# Patient Record
Sex: Male | Born: 1970 | Hispanic: Yes | Marital: Single | Smoking: Current every day smoker
Health system: Southern US, Community
[De-identification: ages and names within clinical notes are randomized; demographics above are authoritative.]

## PROBLEM LIST (undated history)

## (undated) ENCOUNTER — Emergency Department (HOSPITAL_COMMUNITY): Payer: Self-pay

## (undated) DIAGNOSIS — I10 Essential (primary) hypertension: Secondary | ICD-10-CM

## (undated) DIAGNOSIS — E1140 Type 2 diabetes mellitus with diabetic neuropathy, unspecified: Secondary | ICD-10-CM

## (undated) HISTORY — PX: TOE AMPUTATION: SHX809

---

## 2014-12-01 ENCOUNTER — Emergency Department (HOSPITAL_COMMUNITY): Payer: Self-pay

## 2014-12-01 ENCOUNTER — Inpatient Hospital Stay (HOSPITAL_COMMUNITY)
Admission: EM | Admit: 2014-12-01 | Discharge: 2014-12-10 | DRG: 580 | Disposition: A | Discharge status: Home / Self Care | Payer: Self-pay | Attending: Internal Medicine | Admitting: Internal Medicine

## 2014-12-01 ENCOUNTER — Encounter (HOSPITAL_COMMUNITY): Payer: Self-pay

## 2014-12-01 DIAGNOSIS — K5900 Constipation, unspecified: Secondary | ICD-10-CM | POA: Diagnosis present

## 2014-12-01 DIAGNOSIS — S91331A Puncture wound without foreign body, right foot, initial encounter: Secondary | ICD-10-CM

## 2014-12-01 DIAGNOSIS — N179 Acute kidney failure, unspecified: Secondary | ICD-10-CM | POA: Diagnosis present

## 2014-12-01 DIAGNOSIS — S91339A Puncture wound without foreign body, unspecified foot, initial encounter: Secondary | ICD-10-CM | POA: Diagnosis present

## 2014-12-01 DIAGNOSIS — Z794 Long term (current) use of insulin: Secondary | ICD-10-CM

## 2014-12-01 DIAGNOSIS — T368X5A Adverse effect of other systemic antibiotics, initial encounter: Secondary | ICD-10-CM | POA: Diagnosis not present

## 2014-12-01 DIAGNOSIS — I10 Essential (primary) hypertension: Secondary | ICD-10-CM | POA: Diagnosis present

## 2014-12-01 DIAGNOSIS — R112 Nausea with vomiting, unspecified: Secondary | ICD-10-CM

## 2014-12-01 DIAGNOSIS — L97509 Non-pressure chronic ulcer of other part of unspecified foot with unspecified severity: Secondary | ICD-10-CM | POA: Diagnosis present

## 2014-12-01 DIAGNOSIS — E1121 Type 2 diabetes mellitus with diabetic nephropathy: Secondary | ICD-10-CM | POA: Diagnosis present

## 2014-12-01 DIAGNOSIS — Z79899 Other long term (current) drug therapy: Secondary | ICD-10-CM

## 2014-12-01 DIAGNOSIS — L02611 Cutaneous abscess of right foot: Secondary | ICD-10-CM | POA: Diagnosis present

## 2014-12-01 DIAGNOSIS — L02619 Cutaneous abscess of unspecified foot: Secondary | ICD-10-CM | POA: Diagnosis present

## 2014-12-01 DIAGNOSIS — I129 Hypertensive chronic kidney disease with stage 1 through stage 4 chronic kidney disease, or unspecified chronic kidney disease: Secondary | ICD-10-CM | POA: Diagnosis present

## 2014-12-01 DIAGNOSIS — R700 Elevated erythrocyte sedimentation rate: Secondary | ICD-10-CM | POA: Diagnosis present

## 2014-12-01 DIAGNOSIS — F17210 Nicotine dependence, cigarettes, uncomplicated: Secondary | ICD-10-CM | POA: Diagnosis present

## 2014-12-01 DIAGNOSIS — L97519 Non-pressure chronic ulcer of other part of right foot with unspecified severity: Secondary | ICD-10-CM

## 2014-12-01 DIAGNOSIS — E118 Type 2 diabetes mellitus with unspecified complications: Secondary | ICD-10-CM

## 2014-12-01 DIAGNOSIS — N189 Chronic kidney disease, unspecified: Secondary | ICD-10-CM | POA: Diagnosis present

## 2014-12-01 DIAGNOSIS — E1140 Type 2 diabetes mellitus with diabetic neuropathy, unspecified: Secondary | ICD-10-CM | POA: Diagnosis present

## 2014-12-01 DIAGNOSIS — Z23 Encounter for immunization: Secondary | ICD-10-CM

## 2014-12-01 DIAGNOSIS — L03115 Cellulitis of right lower limb: Principal | ICD-10-CM | POA: Diagnosis present

## 2014-12-01 DIAGNOSIS — E1142 Type 2 diabetes mellitus with diabetic polyneuropathy: Secondary | ICD-10-CM

## 2014-12-01 DIAGNOSIS — W450XXA Nail entering through skin, initial encounter: Secondary | ICD-10-CM | POA: Diagnosis present

## 2014-12-01 DIAGNOSIS — E11649 Type 2 diabetes mellitus with hypoglycemia without coma: Secondary | ICD-10-CM | POA: Diagnosis present

## 2014-12-01 DIAGNOSIS — N183 Chronic kidney disease, stage 3 (moderate): Secondary | ICD-10-CM | POA: Diagnosis present

## 2014-12-01 HISTORY — DX: Type 2 diabetes mellitus with diabetic neuropathy, unspecified: E11.40

## 2014-12-01 HISTORY — DX: Essential (primary) hypertension: I10

## 2014-12-01 LAB — CBC WITH DIFFERENTIAL/PLATELET
Basophils Absolute: 0 10*3/uL (ref 0.0–0.1)
Basophils Relative: 0 % (ref 0–1)
EOS ABS: 0.4 10*3/uL (ref 0.0–0.7)
Eosinophils Relative: 3 % (ref 0–5)
HCT: 36.4 % — ABNORMAL LOW (ref 39.0–52.0)
Hemoglobin: 12.3 g/dL — ABNORMAL LOW (ref 13.0–17.0)
LYMPHS ABS: 3.7 10*3/uL (ref 0.7–4.0)
Lymphocytes Relative: 22 % (ref 12–46)
MCH: 29 pg (ref 26.0–34.0)
MCHC: 33.8 g/dL (ref 30.0–36.0)
MCV: 85.8 fL (ref 78.0–100.0)
MONOS PCT: 6 % (ref 3–12)
Monocytes Absolute: 1 10*3/uL (ref 0.1–1.0)
NEUTROS PCT: 69 % (ref 43–77)
Neutro Abs: 11.9 10*3/uL — ABNORMAL HIGH (ref 1.7–7.7)
Platelets: 229 10*3/uL (ref 150–400)
RBC: 4.24 MIL/uL (ref 4.22–5.81)
RDW: 12.5 % (ref 11.5–15.5)
WBC: 17.1 10*3/uL — AB (ref 4.0–10.5)

## 2014-12-01 LAB — BASIC METABOLIC PANEL
Anion gap: 8 (ref 5–15)
BUN: 17 mg/dL (ref 6–20)
CALCIUM: 8.8 mg/dL — AB (ref 8.9–10.3)
CO2: 24 mmol/L (ref 22–32)
Chloride: 104 mmol/L (ref 101–111)
Creatinine, Ser: 1.57 mg/dL — ABNORMAL HIGH (ref 0.61–1.24)
GFR, EST NON AFRICAN AMERICAN: 52 mL/min — AB (ref >60)
Glucose, Bld: 208 mg/dL — ABNORMAL HIGH (ref 70–99)
POTASSIUM: 4.3 mmol/L (ref 3.5–5.1)
Sodium: 136 mmol/L (ref 135–145)

## 2014-12-01 LAB — LACTIC ACID, PLASMA: LACTIC ACID, VENOUS: 0.9 mmol/L (ref 0.5–2.0)

## 2014-12-01 LAB — CK: Total CK: 105 U/L (ref 49–397)

## 2014-12-01 LAB — SEDIMENTATION RATE: Sed Rate: 121 mm/hr — ABNORMAL HIGH (ref 0–16)

## 2014-12-01 LAB — C-REACTIVE PROTEIN: CRP: 15.5 mg/dL — ABNORMAL HIGH (ref <1.0)

## 2014-12-01 MED ORDER — ONDANSETRON HCL 4 MG/2ML IJ SOLN
4.0000 mg | Freq: Four times a day (QID) | INTRAMUSCULAR | Status: DC | PRN
  Administered 2014-12-03 – 2014-12-07 (×2): 4 mg via INTRAVENOUS
  Filled 2014-12-01 (×2): qty 2

## 2014-12-01 MED ORDER — HYDROCODONE-ACETAMINOPHEN 5-325 MG PO TABS
1.0000 | ORAL_TABLET | ORAL | Status: DC | PRN
  Administered 2014-12-02 – 2014-12-05 (×13): 2 via ORAL
  Filled 2014-12-01 (×13): qty 2

## 2014-12-01 MED ORDER — TETANUS-DIPHTH-ACELL PERTUSSIS 5-2.5-18.5 LF-MCG/0.5 IM SUSP
0.5000 mL | Freq: Once | INTRAMUSCULAR | Status: AC
  Administered 2014-12-01: 0.5 mL via INTRAMUSCULAR
  Filled 2014-12-01: qty 0.5

## 2014-12-01 MED ORDER — HYDROMORPHONE HCL 1 MG/ML IJ SOLN
1.0000 mg | Freq: Once | INTRAMUSCULAR | Status: AC
  Administered 2014-12-01: 1 mg via INTRAVENOUS
  Filled 2014-12-01: qty 1

## 2014-12-01 MED ORDER — ACETAMINOPHEN 325 MG PO TABS
650.0000 mg | ORAL_TABLET | Freq: Four times a day (QID) | ORAL | Status: DC | PRN
  Administered 2014-12-06: 650 mg via ORAL
  Filled 2014-12-01: qty 2

## 2014-12-01 MED ORDER — INSULIN ASPART 100 UNIT/ML ~~LOC~~ SOLN: 0.0000 [IU] | Freq: Every day | SUBCUTANEOUS | Status: DC

## 2014-12-01 MED ORDER — GABAPENTIN 100 MG PO CAPS
100.0000 mg | ORAL_CAPSULE | Freq: Two times a day (BID) | ORAL | Status: DC
  Administered 2014-12-02 – 2014-12-10 (×18): 100 mg via ORAL
  Filled 2014-12-01 (×18): qty 1

## 2014-12-01 MED ORDER — VANCOMYCIN HCL IN DEXTROSE 1-5 GM/200ML-% IV SOLN
1000.0000 mg | Freq: Once | INTRAVENOUS | Status: AC
  Administered 2014-12-01: 1000 mg via INTRAVENOUS
  Filled 2014-12-01: qty 200

## 2014-12-01 MED ORDER — ONDANSETRON HCL 4 MG PO TABS
4.0000 mg | ORAL_TABLET | Freq: Four times a day (QID) | ORAL | Status: DC | PRN
  Administered 2014-12-06: 4 mg via ORAL
  Filled 2014-12-01: qty 1

## 2014-12-01 MED ORDER — ACETAMINOPHEN 650 MG RE SUPP
650.0000 mg | Freq: Four times a day (QID) | RECTAL | Status: DC | PRN
Start: 2014-12-01 — End: 2014-12-10

## 2014-12-01 MED ORDER — PIPERACILLIN-TAZOBACTAM 3.375 G IVPB 30 MIN
3.3750 g | Freq: Once | INTRAVENOUS | Status: AC
  Administered 2014-12-01: 3.375 g via INTRAVENOUS
  Filled 2014-12-01: qty 50

## 2014-12-01 MED ORDER — INSULIN ASPART 100 UNIT/ML ~~LOC~~ SOLN
0.0000 [IU] | Freq: Three times a day (TID) | SUBCUTANEOUS | Status: DC
  Administered 2014-12-02: 2 [IU] via SUBCUTANEOUS
  Administered 2014-12-02: 3 [IU] via SUBCUTANEOUS
  Administered 2014-12-02: 5 [IU] via SUBCUTANEOUS
  Administered 2014-12-03: 3 [IU] via SUBCUTANEOUS
  Administered 2014-12-04: 2 [IU] via SUBCUTANEOUS
  Administered 2014-12-05 – 2014-12-06 (×2): 3 [IU] via SUBCUTANEOUS
  Administered 2014-12-06: 2 [IU] via SUBCUTANEOUS
  Administered 2014-12-06: 3 [IU] via SUBCUTANEOUS
  Administered 2014-12-07: 5 [IU] via SUBCUTANEOUS
  Administered 2014-12-08: 3 [IU] via SUBCUTANEOUS
  Administered 2014-12-09: 2 [IU] via SUBCUTANEOUS
  Administered 2014-12-09: 3 [IU] via SUBCUTANEOUS
  Administered 2014-12-10 (×2): 2 [IU] via SUBCUTANEOUS

## 2014-12-01 MED ORDER — HEPARIN SODIUM (PORCINE) 5000 UNIT/ML IJ SOLN
5000.0000 [IU] | Freq: Three times a day (TID) | INTRAMUSCULAR | Status: DC
  Administered 2014-12-02 – 2014-12-10 (×22): 5000 [IU] via SUBCUTANEOUS
  Filled 2014-12-01 (×21): qty 1

## 2014-12-01 NOTE — ED Notes (Signed)
Pt stepped on a nail 3 days ago and now presents with redness and swelling and pain to right foot.

## 2014-12-01 NOTE — ED Provider Notes (Signed)
CSN: [id number]     Arrival date & time 12/01/14  1653 History   First MD Initiated Contact with Patient 12/01/14 1908     Chief Complaint  Patient presents with  . Foot Pain  . Cellulitis     HPI Patient reports he stepped on a nail 4 days ago with his right foot.  This nail came through his shoe.  He now presents emergency department because of increasing pain, redness, swelling of his right foot with the erythema streaking up to his right medial malleolus.  He is a diabetic.  He takes insulin.  [place].  He's had prior infection in his left foot before which resulted in imitation of toe.  He is concerned about the possibility of worsening infection.  Chills at home without documented fever.   Past Medical History  Diagnosis Date  . Diabetes mellitus without complication   . Hypertension    Past Surgical History  Procedure Laterality Date  . Toe amputation Left     great toe   History reviewed. No pertinent family history. History  Substance Use Topics  . Smoking status: Current Every Day Smoker  . Smokeless tobacco: Not on file  . Alcohol Use: No    Review of Systems  All other systems reviewed and are negative.     Allergies  Review of patient's allergies indicates no known allergies.  Home Medications   Prior to Admission medications   Not on File   BP 196/80 mmHg  Pulse 99  Temp(Src) 99 F (37.2 C) (Oral)  Resp 18  Ht 5\' 6"  (1.676 m)  Wt 153 lb 11.2 oz (69.718 kg)  BMI 24.82 kg/m2  SpO2 100% Physical Exam  Constitutional: He is oriented to person, place, and time. He appears well-developed and well-nourished.  HENT:  Head: Normocephalic.  Eyes: EOM are normal.  Neck: Normal range of motion.  Cardiovascular: Regular rhythm.   Tachycardia  Pulmonary/Chest: Effort normal.  Abdominal: He exhibits no distension.  Musculoskeletal:  Obvious puncture wound of the volar surface of the right foot around the midfoot.   Streaking erythema and swelling with associated tenderness of the right foot along the medial malleolus.  No fluctuance.  No drainage.  Normal pulses in right foot.  Neurological: He is alert and oriented to person, place, and time.  Psychiatric: He has a normal mood and affect.  Nursing note and vitals reviewed.   ED Course  Procedures (including critical care time) Labs Review Labs Reviewed  CBC WITH DIFFERENTIAL/PLATELET - Abnormal; Notable for the following:    WBC 17.1 (*)    Hemoglobin 12.3 (*)    HCT 36.4 (*)    Neutro Abs 11.9 (*)    All other components within normal limits  BASIC METABOLIC PANEL - Abnormal; Notable for the following:    Glucose, Bld 208 (*)    Creatinine, Ser 1.57 (*)    Calcium 8.8 (*)    GFR calc non Af Amer 52 (*)    All other components within normal limits  SEDIMENTATION RATE  C-REACTIVE PROTEIN    Imaging Review Dg Foot Complete Right  12/01/2014   CLINICAL DATA:  Right foot pain stepped on nail 3 days ago redness swelling around foot cellulitis puncture wound lateral aspect first toe  EXAM: RIGHT FOOT COMPLETE - 3+ VIEW  COMPARISON:  None.  FINDINGS: Soft tissue swelling first toe. No fracture or dislocation. No radiodense foreign body. No evidence of  periosteal reaction or osteomyelitis.  IMPRESSION: Soft tissue swelling suggesting cellulitis.   Electronically Signed   By: Esperanza Heir M.D.   On: 12/01/2014 20:18  I personally reviewed the imaging tests through PACS system I reviewed available ER/hospitalization records through the EMR    EKG Interpretation None      MDM   Final diagnoses:  None    Given partial wound and diabetes patient is at high risk for worsening infection.  Will omit to the hospital for observation.  IV antibiotics now.  If his symptoms do not improve he'll likely need MRI of his right foot to evaluate for deep space infection plus minus operative washout.    Azalia Bilis, MD 12/01/14 2035

## 2014-12-01 NOTE — ED Notes (Signed)
Patient transported to X-ray 

## 2014-12-01 NOTE — ED Notes (Signed)
Cellulitis Borders outlined

## 2014-12-01 NOTE — H&P (Signed)
[facility] History and Physical  Patient: Juan Werner  MRN: [id number]  DOB: Feb 09, 1971  DOS: the patient was seen and examined on 12/01/2014 PCP: No primary care provider on file.  Referring physician: Dr. Venora Maples Chief Complaint: Leg pain  HPI: Kean Gautreau is a 44 y.o. male with Past medical history of diabetes mellitus hypertension and neuropathy. The patient is presenting with complaints of foot pain on the right. He mentions that 2 days ago while at work he placed his leg on a nail and had injury. After that he had progressively worsening foot pain. He has swelling of his foot with redness and worsening swelling. Therefore he came to the hospital. He has bilateral burning pain of his foot which is chronic. He denies any fever but 5 chills. Does not have any shortness of breath or cough does not have any nausea or vomiting no complaints of abdominal pain. No abdominal pain diarrhea nausea vomiting.  The patient is coming from home And at his baseline independent for most of his ADL.  Review of Systems: as mentioned in the history of present illness.  A comprehensive review of the other systems is negative.  Past Medical History  Diagnosis Date  . Diabetes mellitus without complication   . Hypertension    Past Surgical History  Procedure Laterality Date  . Toe amputation Left     great toe   Social History:  reports that he has been smoking.  He does not have any smokeless tobacco history on file. He reports that he does not drink alcohol or use illicit drugs.  No Known Allergies  History reviewed. No pertinent family history.  Prior to Admission medications   Medication Sig Start Date End Date Taking? Authorizing Provider  acetaminophen (TYLENOL) 325 MG tablet Take 325 mg by mouth every 6 (six) hours as needed for mild pain or moderate pain.   Yes Historical Provider, MD  amLODipine (NORVASC) 10 MG tablet Take 10 mg by mouth daily.   Yes Historical  Provider, MD  gabapentin (NEURONTIN) 100 MG capsule Take 100 mg by mouth 2 (two) times daily.   Yes Historical Provider, MD  insulin NPH-regular Human (NOVOLIN 70/30) (70-30) 100 UNIT/ML injection Inject 25 Units into the skin 2 (two) times daily with a meal.   Yes Historical Provider, MD  lisinopril (PRINIVIL,ZESTRIL) 40 MG tablet Take 40 mg by mouth daily.   Yes Historical Provider, MD    Physical Exam: Filed Vitals:   12/01/14 2130 12/01/14 2145 12/01/14 2200 12/01/14 2215  BP: 146/76 179/79 170/80 151/74  Pulse: 85 95 91 85  Temp:      TempSrc:      Resp:      Height:      Weight:      SpO2: 99% 100% 100% 99%    General: Alert, Awake and Oriented to Time, Place and Person. Appear in mild distress Eyes: PERRL ENT: Oral Mucosa clear moist. Neck: no JVD Cardiovascular: S1 and S2 Present, no Murmur, Peripheral Pulses Present Respiratory: Bilateral Air entry equal and Decreased,  Clear to Auscultation, no Crackles, no wheezes Abdomen: Bowel Sound present, Soft and non tender Skin: puncture wound on the right foot with erythema and swelling and induration of the surrounding area without any discharge  Extremities: Right  Pedal edema,  bilateral leg tenderness Neurologic: lower extremity paresthesia  Labs on Admission:  CBC:  Recent Labs Lab 12/01/14 1930  WBC 17.1*  NEUTROABS 11.9*  HGB 12.3*  HCT 36.4*  MCV  85.8  PLT 229    CMP     Component Value Date/Time   NA 136 12/01/2014 1930   K 4.3 12/01/2014 1930   CL 104 12/01/2014 1930   CO2 24 12/01/2014 1930   GLUCOSE 208* 12/01/2014 1930   BUN 17 12/01/2014 1930   CREATININE 1.57* 12/01/2014 1930   CALCIUM 8.8* 12/01/2014 1930   GFRNONAA 52* 12/01/2014 1930   GFRAA >60 12/01/2014 1930    No results for input(s): LIPASE, AMYLASE in the last 168 hours.   Recent Labs Lab 12/01/14 2210  CKTOTAL 105   BNP (last 3 results) No results for input(s): BNP in the last 8760 hours.  ProBNP (last 3 results) No  results for input(s): PROBNP in the last 8760 hours.   Radiological Exams on Admission: Dg Foot Complete Right  12/01/2014   CLINICAL DATA:  Right foot pain stepped on nail 3 days ago redness swelling around foot cellulitis puncture wound lateral aspect first toe  EXAM: RIGHT FOOT COMPLETE - 3+ VIEW  COMPARISON:  None.  FINDINGS: Soft tissue swelling first toe. No fracture or dislocation. No radiodense foreign body. No evidence of periosteal reaction or osteomyelitis.  IMPRESSION: Soft tissue swelling suggesting cellulitis.   Electronically Signed   By: Skipper Cliche M.D.   On: 12/01/2014 20:18   Assessment/Plan Principal Problem:   Cellulitis of right foot Active Problems:   Neuropathic foot ulcer   Essential hypertension   Diabetic neuropathy   CKD (chronic kidney disease)   1. Cellulitis of right foot The patient is presenting with complaints of right foot infection. He has diabetes and hypertension with chronic peripheral neuropathy. The injuries from a nail wound. The patient is significantly elevated ESR as well as CRP. Concern is for this infection has been progressing further. At present I will continue treating him with broad-spectrum antibiotics. Follow cultures.  2. Excision hypertension. The patient complains that he has history of blood pressure but does not remember his medications. at present his blood pressure is stable we'll continue to closely monitor.  3. Diabetes mellitus with nephropathy. We will place him on sliding scale. Patient mentions he takes insulin twice a day but does not be the dose. check hemoglobin A1c prior control unknown.  4. Nail injury Patient has received tetanus vaccine.  DVT Prophylaxis: subcutaneous Heparin Nutrition:  Diabetic diet  Disposition: Admitted as inpatient, med-surge unit.  Author: Berle Mull, MD [facility] Pager: [phone] 12/01/2014  If 7PM-7AM, please contact night-coverage [email or web address] Password  [email or web address]

## 2014-12-01 NOTE — ED Notes (Signed)
Admitting md at bedsside

## 2014-12-01 NOTE — ED Notes (Signed)
Report called to Tess Admitting at bedside.

## 2014-12-02 ENCOUNTER — Encounter (HOSPITAL_COMMUNITY): Payer: Self-pay | Admitting: Internal Medicine

## 2014-12-02 LAB — COMPREHENSIVE METABOLIC PANEL
ALT: 12 U/L — ABNORMAL LOW (ref 17–63)
ANION GAP: 8 (ref 5–15)
AST: 13 U/L — ABNORMAL LOW (ref 15–41)
Albumin: 2.2 g/dL — ABNORMAL LOW (ref 3.5–5.0)
Alkaline Phosphatase: 58 U/L (ref 38–126)
BUN: 16 mg/dL (ref 6–20)
CO2: 23 mmol/L (ref 22–32)
Calcium: 8.3 mg/dL — ABNORMAL LOW (ref 8.9–10.3)
Chloride: 104 mmol/L (ref 101–111)
Creatinine, Ser: 1.52 mg/dL — ABNORMAL HIGH (ref 0.61–1.24)
GFR calc Af Amer: >60 mL/min (ref >60)
GFR, EST NON AFRICAN AMERICAN: 55 mL/min — AB (ref >60)
Glucose, Bld: 231 mg/dL — ABNORMAL HIGH (ref 70–99)
POTASSIUM: 4.5 mmol/L (ref 3.5–5.1)
Sodium: 135 mmol/L (ref 135–145)
TOTAL PROTEIN: 5.9 g/dL — AB (ref 6.5–8.1)
Total Bilirubin: 0.5 mg/dL (ref 0.3–1.2)

## 2014-12-02 LAB — CBC
HCT: 35.2 % — ABNORMAL LOW (ref 39.0–52.0)
HEMOGLOBIN: 11.7 g/dL — AB (ref 13.0–17.0)
MCH: 28.5 pg (ref 26.0–34.0)
MCHC: 33.2 g/dL (ref 30.0–36.0)
MCV: 85.9 fL (ref 78.0–100.0)
PLATELETS: 220 10*3/uL (ref 150–400)
RBC: 4.1 MIL/uL — AB (ref 4.22–5.81)
RDW: 12.4 % (ref 11.5–15.5)
WBC: 16 10*3/uL — AB (ref 4.0–10.5)

## 2014-12-02 LAB — GLUCOSE, CAPILLARY
Glucose-Capillary: 122 mg/dL — ABNORMAL HIGH (ref 70–99)
Glucose-Capillary: 170 mg/dL — ABNORMAL HIGH (ref 70–99)
Glucose-Capillary: 186 mg/dL — ABNORMAL HIGH (ref 70–99)
Glucose-Capillary: 210 mg/dL — ABNORMAL HIGH (ref 70–99)
Glucose-Capillary: 64 mg/dL — ABNORMAL LOW (ref 70–99)
Glucose-Capillary: 67 mg/dL — ABNORMAL LOW (ref 70–99)
Glucose-Capillary: 92 mg/dL (ref 70–99)

## 2014-12-02 MED ORDER — LISINOPRIL 40 MG PO TABS
40.0000 mg | ORAL_TABLET | Freq: Every day | ORAL | Status: DC
  Administered 2014-12-02 – 2014-12-06 (×5): 40 mg via ORAL
  Filled 2014-12-02: qty 1
  Filled 2014-12-02: qty 2
  Filled 2014-12-02 (×2): qty 1
  Filled 2014-12-02: qty 2

## 2014-12-02 MED ORDER — INSULIN ASPART PROT & ASPART (70-30 MIX) 100 UNIT/ML ~~LOC~~ SUSP
20.0000 [IU] | Freq: Two times a day (BID) | SUBCUTANEOUS | Status: DC
  Administered 2014-12-02: 20 [IU] via SUBCUTANEOUS
  Administered 2014-12-03: 18 [IU] via SUBCUTANEOUS
  Filled 2014-12-02: qty 10

## 2014-12-02 MED ORDER — AMLODIPINE BESYLATE 10 MG PO TABS
10.0000 mg | ORAL_TABLET | Freq: Every day | ORAL | Status: DC
  Administered 2014-12-02 – 2014-12-10 (×9): 10 mg via ORAL
  Filled 2014-12-02 (×9): qty 1

## 2014-12-02 MED ORDER — GLUCOSE 40 % PO GEL
ORAL | Status: AC
  Administered 2014-12-02: 37.5 g
  Filled 2014-12-02: qty 1

## 2014-12-02 MED ORDER — VANCOMYCIN HCL IN DEXTROSE 1-5 GM/200ML-% IV SOLN
1000.0000 mg | Freq: Two times a day (BID) | INTRAVENOUS | Status: DC
  Administered 2014-12-02 – 2014-12-03 (×3): 1000 mg via INTRAVENOUS
  Filled 2014-12-02 (×5): qty 200

## 2014-12-02 MED ORDER — PIPERACILLIN-TAZOBACTAM 3.375 G IVPB
3.3750 g | Freq: Three times a day (TID) | INTRAVENOUS | Status: DC
  Administered 2014-12-02 – 2014-12-09 (×21): 3.375 g via INTRAVENOUS
  Filled 2014-12-02 (×24): qty 50

## 2014-12-02 NOTE — Progress Notes (Signed)
ANTIBIOTIC CONSULT NOTE - INITIAL  Pharmacy Consult for Vancomycin and Zosyn  Indication: cellulitis  No Known Allergies  Patient Measurements: Height: 5\' 6"  (167.6 cm) Weight: 152 lb 8.9 oz (69.2 kg) IBW/kg (Calculated) : 63.8  Vital Signs: Temp: 99.3 F (37.4 C) (05/01 2305) Temp Source: Oral (05/01 2305) BP: 169/82 mmHg (05/01 2305) Pulse Rate: 92 (05/01 2305) Intake/Output from previous day: 05/01 0701 - 05/02 0700 In: 250 [P.O.:250] Out: 300 [Urine:300] Intake/Output from this shift: Total I/O In: 250 [P.O.:250] Out: 300 [Urine:300]  Labs:  Recent Labs  12/01/14 1930  WBC 17.1*  HGB 12.3*  PLT 229  CREATININE 1.57*   Estimated Creatinine Clearance: 54.7 mL/min (by C-G formula based on Cr of 1.57). No results for input(s): VANCOTROUGH, VANCOPEAK, VANCORANDOM, GENTTROUGH, GENTPEAK, GENTRANDOM, TOBRATROUGH, TOBRAPEAK, TOBRARND, AMIKACINPEAK, AMIKACINTROU, AMIKACIN in the last 72 hours.   Microbiology: No results found for this or any previous visit (from the past 720 hour(s)).  Medical History: Past Medical History  Diagnosis Date  . Diabetes mellitus with neuropathy   . Hypertension     Medications:  Prescriptions prior to admission  Medication Sig Dispense Refill Last Dose  . acetaminophen (TYLENOL) 325 MG tablet Take 325 mg by mouth every 6 (six) hours as needed for mild pain or moderate pain.   unknown  . amLODipine (NORVASC) 10 MG tablet Take 10 mg by mouth daily.   unknown  . gabapentin (NEURONTIN) 100 MG capsule Take 100 mg by mouth 2 (two) times daily.   unknown  . insulin NPH-regular Human (NOVOLIN 70/30) (70-30) 100 UNIT/ML injection Inject 25 Units into the skin 2 (two) times daily with a meal.   unknown  . lisinopril (PRINIVIL,ZESTRIL) 40 MG tablet Take 40 mg by mouth daily.   unknown   Assessment: 44 y.o. male with RLE pain/cellulitis for empiric antibiotics.  Vancomycin 1 g IV given in ED at 2030  Goal of Therapy:  Vancomycin trough level  10-15 mcg/ml  Plan:  Vancomcyin 1 g IV q12h Zosyn 3.375 g IV q8h   Juan Candlebbott, Juan Werner 12/02/2014,4:21 AM

## 2014-12-02 NOTE — Progress Notes (Signed)
[facility] PROGRESS NOTE  Juan Werner ZOX:[id number] DOB: May 17, 1971 DOA: 12/01/2014 PCP: No primary care provider on file.  Assessment/Plan: Principal Problem:   Cellulitis of right foot Active Problems:   Neuropathic foot ulcer   Essential hypertension   Diabetic neuropathy   CKD (chronic kidney disease)   Cellulitis of the right foot Continue broad-spectrum antibiotics Follow culture results The patient deteriorates clinically, will order of MRI of the right foot  Hypertension Continue ACE inhibitor, Norvasc  Diabetes mellitus A1c pending Continue insulin 70/30, sliding scale insulin  Peripheral neuropathy Continue gabapentin  Code Status: full Family Communication: family updated about patient's clinical progress Disposition Plan:  As above    Brief narrative: Juan Werner is a 44 y.o. male with Past medical history of diabetes mellitus hypertension and neuropathy. The patient is presenting with complaints of foot pain on the right. He mentions that 2 days ago while at work he placed his leg on a nail and had injury. After that he had progressively worsening foot pain. He has swelling of his foot with redness and worsening swelling. Therefore he came to the hospital. He has bilateral burning pain of his foot which is chronic. He denies any fever but 5 chills. Does not have any shortness of breath or cough does not have any nausea or vomiting no complaints of abdominal pain. No abdominal pain diarrhea nausea vomiting.  The patient is coming from home And at his baseline independent for most of his ADL.  Consultants:  None  Procedures:  None  Antibiotics: Vancomycin/Zosyn   HPI/Subjective: Hypertensive, low-grade fever this morning, does not speak English but understands a few words  Objective: Filed Vitals:   12/01/14 2200 12/01/14 2215 12/01/14 2305 12/02/14 0625  BP: 170/80 151/74 169/82 157/80  Pulse: 91 85 92 76  Temp:   99.3 F  (37.4 C) 97.8 F (36.6 C)  TempSrc:   Oral   Resp:   18 17  Height:    (1.676 m)   Weight:   69.2 kg (152 lb 8.9 oz)   SpO2: 100% 99% 100% 99%    Intake/Output Summary (Last 24 hours) at 12/02/14 0900 Last data filed at 12/02/14 0600  Gross per 24 hour  Intake    750 ml  Output    500 ml  Net    250 ml    Exam:  General: No acute respiratory distress Lungs: Clear to auscultation bilaterally without wheezes or crackles Cardiovascular: Regular rate and rhythm without murmur gallop or rub normal S1 and S2 Abdomen: Nontender, nondistended, soft, bowel sounds positive, no rebound, no ascites, no appreciable mass Extremities: No significant cyanosis, clubbing, or edema bilateral lower extremities Skin: puncture wound on the right foot with erythema and swelling and induration of the surrounding area without any discharge      Data Reviewed: Basic Metabolic Panel:  Recent Labs Lab 12/01/14 1930 12/02/14 0453  NA 136 135  K 4.3 4.5  CL 104 104  CO2 24 23  GLUCOSE 208* 231*  BUN 17 16  CREATININE 1.57* 1.52*  CALCIUM 8.8* 8.3*    Liver Function Tests:  Recent Labs Lab 12/02/14 0453  AST 13*  ALT 12*  ALKPHOS 58  BILITOT 0.5  PROT 5.9*  ALBUMIN 2.2*   No results for input(s): LIPASE, AMYLASE in the last 168 hours. No results for input(s): AMMONIA in the last 168 hours.  CBC:  Recent Labs Lab 12/01/14 1930 12/02/14 0453  WBC 17.1* 16.0*  NEUTROABS 11.9*  --  HGB 12.3* 11.7*  HCT 36.4* 35.2*  MCV 85.8 85.9  PLT 229 220    Cardiac Enzymes:  Recent Labs Lab 12/01/14 2210  CKTOTAL 105   BNP (last 3 results) No results for input(s): BNP in the last 8760 hours.  ProBNP (last 3 results) No results for input(s): PROBNP in the last 8760 hours.    CBG:  Recent Labs Lab 12/01/14 2353 12/02/14 0828  GLUCAP 122* 210*    No results found for this or any previous visit (from the past 240 hour(s)).   Studies: Dg Foot Complete  Right  12/01/2014   CLINICAL DATA:  Right foot pain stepped on nail 3 days ago redness swelling around foot cellulitis puncture wound lateral aspect first toe  EXAM: RIGHT FOOT COMPLETE - 3+ VIEW  COMPARISON:  None.  FINDINGS: Soft tissue swelling first toe. No fracture or dislocation. No radiodense foreign body. No evidence of periosteal reaction or osteomyelitis.  IMPRESSION: Soft tissue swelling suggesting cellulitis.   Electronically Signed   By: Esperanza Heiraymond  Rubner M.D.   On: 12/01/2014 20:18    Scheduled Meds: . gabapentin  100 mg Oral BID  . heparin  5,000 Units Subcutaneous 3 times per day  . insulin aspart  0-15 Units Subcutaneous TID WC  . insulin aspart  0-5 Units Subcutaneous QHS  . piperacillin-tazobactam (ZOSYN)  IV  3.375 g Intravenous 3 times per day  . vancomycin  1,000 mg Intravenous Q12H   Continuous Infusions:   Principal Problem:   Cellulitis of right foot Active Problems:   Neuropathic foot ulcer   Essential hypertension   Diabetic neuropathy   CKD (chronic kidney disease)    Time spent: 40 minutes   [facility] Werner  [facility] Pager [phone]. If 7PM-7AM, please contact night-coverage at [email or web address], password [email or web address] 12/02/2014, 9:00 AM  LOS: 1 day

## 2014-12-02 NOTE — Progress Notes (Signed)
UR COMPLETED  

## 2014-12-03 ENCOUNTER — Inpatient Hospital Stay (HOSPITAL_COMMUNITY): Payer: Self-pay

## 2014-12-03 LAB — COMPREHENSIVE METABOLIC PANEL
ALBUMIN: 2 g/dL — AB (ref 3.5–5.0)
ALT: 10 U/L — AB (ref 17–63)
AST: 12 U/L — AB (ref 15–41)
Alkaline Phosphatase: 53 U/L (ref 38–126)
Anion gap: 8 (ref 5–15)
BUN: 19 mg/dL (ref 6–20)
CO2: 24 mmol/L (ref 22–32)
Calcium: 8.5 mg/dL — ABNORMAL LOW (ref 8.9–10.3)
Chloride: 104 mmol/L (ref 101–111)
Creatinine, Ser: 1.69 mg/dL — ABNORMAL HIGH (ref 0.61–1.24)
GFR calc Af Amer: 56 mL/min — ABNORMAL LOW (ref >60)
GFR calc non Af Amer: 48 mL/min — ABNORMAL LOW (ref >60)
Glucose, Bld: 162 mg/dL — ABNORMAL HIGH (ref 70–99)
Potassium: 4.4 mmol/L (ref 3.5–5.1)
Sodium: 136 mmol/L (ref 135–145)
TOTAL PROTEIN: 5.6 g/dL — AB (ref 6.5–8.1)
Total Bilirubin: 0.5 mg/dL (ref 0.3–1.2)

## 2014-12-03 LAB — GLUCOSE, CAPILLARY
Glucose-Capillary: 111 mg/dL — ABNORMAL HIGH (ref 70–99)
Glucose-Capillary: 131 mg/dL — ABNORMAL HIGH (ref 70–99)
Glucose-Capillary: 160 mg/dL — ABNORMAL HIGH (ref 70–99)
Glucose-Capillary: 178 mg/dL — ABNORMAL HIGH (ref 70–99)
Glucose-Capillary: 68 mg/dL — ABNORMAL LOW (ref 70–99)
Glucose-Capillary: 82 mg/dL (ref 70–99)
Glucose-Capillary: 89 mg/dL (ref 70–99)

## 2014-12-03 LAB — CBC
HCT: 30.9 % — ABNORMAL LOW (ref 39.0–52.0)
HEMOGLOBIN: 10.5 g/dL — AB (ref 13.0–17.0)
MCH: 29.5 pg (ref 26.0–34.0)
MCHC: 34 g/dL (ref 30.0–36.0)
MCV: 86.8 fL (ref 78.0–100.0)
Platelets: 211 10*3/uL (ref 150–400)
RBC: 3.56 MIL/uL — AB (ref 4.22–5.81)
RDW: 12.3 % (ref 11.5–15.5)
WBC: 12.7 10*3/uL — ABNORMAL HIGH (ref 4.0–10.5)

## 2014-12-03 LAB — HEMOGLOBIN A1C
Hgb A1c MFr Bld: 8.6 % — ABNORMAL HIGH (ref 4.8–5.6)
Mean Plasma Glucose: 200 mg/dL

## 2014-12-03 MED ORDER — BISACODYL 10 MG RE SUPP
10.0000 mg | Freq: Every day | RECTAL | Status: DC | PRN
  Administered 2014-12-03 – 2014-12-07 (×2): 10 mg via RECTAL
  Filled 2014-12-03 (×2): qty 1

## 2014-12-03 MED ORDER — VANCOMYCIN HCL IN DEXTROSE 750-5 MG/150ML-% IV SOLN
750.0000 mg | Freq: Two times a day (BID) | INTRAVENOUS | Status: DC
  Administered 2014-12-03 – 2014-12-05 (×3): 750 mg via INTRAVENOUS
  Filled 2014-12-03 (×5): qty 150

## 2014-12-03 MED ORDER — INSULIN ASPART PROT & ASPART (70-30 MIX) 100 UNIT/ML ~~LOC~~ SUSP
18.0000 [IU] | Freq: Two times a day (BID) | SUBCUTANEOUS | Status: DC
  Administered 2014-12-03 – 2014-12-08 (×9): 18 [IU] via SUBCUTANEOUS

## 2014-12-03 MED ORDER — SODIUM CHLORIDE 0.9 % IV SOLN: INTRAVENOUS | Status: AC

## 2014-12-03 NOTE — Progress Notes (Signed)
Interpreter Wyvonnia DuskyGraciela Namihira for [employer]

## 2014-12-03 NOTE — Progress Notes (Signed)
Hypoglycemic Event  CBG: 64  Treatment: 1 tube instant glucose  Symptoms: Pale and Shaky  Follow-up CBG: Time:2240 CBG Result:92  Possible Reasons for Event: Unknown  Comments/MD notified:K Black    Jameca Chumley B  Remember to initiate Hypoglycemia Order Set & complete

## 2014-12-03 NOTE — Progress Notes (Signed)
[facility] PROGRESS NOTE  Juan Werner EAV:[id number] DOB: 27-Jan-1971 DOA: 12/01/2014 PCP: No primary care provider on file.  Assessment/Plan: Principal Problem:   Cellulitis of right foot Active Problems:   Neuropathic foot ulcer   Essential hypertension   Diabetic neuropathy   CKD (chronic kidney disease)   Cellulitis of the right foot Continue broad-spectrum antibiotics, follow blood culture Improving slowly, improving leukocytosis if patient deteriorates clinically, will order of MRI of the right foot Mobilized, PT/OT eval  Hypertension Continue ACE inhibitor, Norvasc  Diabetes mellitus A1c 8.6 Reduce insulin 70/30 to 18 units twice a day given hypoglycemic event last night, sliding scale insulin  Peripheral neuropathy Continue gabapentin  Chronic kidney disease stage III Creatinine unchanged, likely at baseline, baseline is not available in our system  Nausea Likely due to constipation Will administer supository and start stool softner   Code Status: full Family Communication: family updated about patient's clinical progress Disposition Plan:  Anticipate discharge in one to 2 days    Brief narrative: Juan Werner is a 44 y.o. male with Past medical history of diabetes mellitus hypertension and neuropathy. The patient is presenting with complaints of foot pain on the right. He mentions that 2 days ago while at work he placed his leg on a nail and had injury. After that he had progressively worsening foot pain. He has swelling of his foot with redness and worsening swelling. Therefore he came to the hospital. He has bilateral burning pain of his foot which is chronic. He denies any fever but 5 chills. Does not have any shortness of breath or cough does not have any nausea or vomiting no complaints of abdominal pain. No abdominal pain diarrhea nausea vomiting.  The patient is coming from home And at his baseline independent for most of his  ADL.  Consultants:  None  Procedures:  None  Antibiotics: Vancomycin/Zosyn   HPI/Subjective: Hypoglycemic yesterday evening, cellulitis is improving  Objective: Filed Vitals:   12/01/14 2305 12/02/14 0625 12/02/14 2209 12/03/14 0557  BP: 169/82 157/80 152/70 124/71  Pulse: 92 76 85 83  Temp: 99.3 F (37.4 C) 97.8 F (36.6 C) 99 F (37.2 C) 98.5 F (36.9 C)  TempSrc: Oral  Oral Oral  Resp: Height:  (1.676 m)     Weight: 69.2 kg (152 lb 8.9 oz)     SpO2: 100% 99% 100% 99%    Intake/Output Summary (Last 24 hours) at 12/03/14 0901 Last data filed at 12/03/14 0541  Gross per 24 hour  Intake    128 ml  Output   1225 ml  Net  -1097 ml    Exam:  General: No acute respiratory distress Lungs: Clear to auscultation bilaterally without wheezes or crackles Cardiovascular: Regular rate and rhythm without murmur gallop or rub normal S1 and S2 Abdomen: Nontender, nondistended, soft, bowel sounds positive, no rebound, no ascites, no appreciable mass Extremities: No significant cyanosis, clubbing, or edema bilateral lower extremities Skin: puncture wound on the right foot with erythema and swelling and induration of the surrounding area without any discharge      Data Reviewed: Basic Metabolic Panel:  Recent Labs Lab 12/01/14 1930 12/02/14 0453 12/03/14 0445  NA 136 135 136  K 4.3 4.5 4.4  CL 104 104 104  CO2 GLUCOSE 208* 231* 162*  BUN CREATININE 1.57* 1.52* 1.69*  CALCIUM 8.8* 8.3* 8.5*    Liver Function Tests:  Recent Labs Lab 12/02/14  [id number] 12/03/14 0445  AST 13* 12*  ALT 12* 10*  ALKPHOS 58 53  BILITOT 0.5 0.5  PROT 5.9* 5.6*  ALBUMIN 2.2* 2.0*   No results for input(s): LIPASE, AMYLASE in the last 168 hours. No results for input(s): AMMONIA in the last 168 hours.  CBC:  Recent Labs Lab 12/01/14 1930 12/02/14 0453 12/03/14 0445  WBC 17.1* 16.0* 12.7*  NEUTROABS 11.9*  --   --   HGB 12.3* 11.7*  10.5*  HCT 36.4* 35.2* 30.9*  MCV 85.8 85.9 86.8  PLT 229 220 211    Cardiac Enzymes:  Recent Labs Lab 12/01/14 2210  CKTOTAL 105   BNP (last 3 results) No results for input(s): BNP in the last 8760 hours.  ProBNP (last 3 results) No results for input(s): PROBNP in the last 8760 hours.    CBG:  Recent Labs Lab 12/02/14 2208 12/02/14 2228 12/02/14 2240 12/03/14 12/03/14 0754  GLUCAP 67* 64* 92 111* 160*    No results found for this or any previous visit (from the past 240 hour(s)).   Studies: Dg Foot Complete Right  12/01/2014   CLINICAL DATA:  Right foot pain stepped on nail 3 days ago redness swelling around foot cellulitis puncture wound lateral aspect first toe  EXAM: RIGHT FOOT COMPLETE - 3+ VIEW  COMPARISON:  None.  FINDINGS: Soft tissue swelling first toe. No fracture or dislocation. No radiodense foreign body. No evidence of periosteal reaction or osteomyelitis.  IMPRESSION: Soft tissue swelling suggesting cellulitis.   Electronically Signed   By: Esperanza Heiraymond  Rubner M.D.   On: 12/01/2014 20:18    Scheduled Meds: . amLODipine  10 mg Oral Daily  . gabapentin  100 mg Oral BID  . heparin  5,000 Units Subcutaneous 3 times per day  . insulin aspart  0-15 Units Subcutaneous TID WC  . insulin aspart  0-5 Units Subcutaneous QHS  . insulin aspart protamine- aspart  18 Units Subcutaneous BID WC  . lisinopril  40 mg Oral Daily  . piperacillin-tazobactam (ZOSYN)  IV  3.375 g Intravenous 3 times per day  . vancomycin  1,000 mg Intravenous Q12H   Continuous Infusions: . sodium chloride      Principal Problem:   Cellulitis of right foot Active Problems:   Neuropathic foot ulcer   Essential hypertension   Diabetic neuropathy   CKD (chronic kidney disease)    Time spent: 40 minutes   [facility] CenterBROL,Deago Burruss  [facility] Pager [phone]. If 7PM-7AM, please contact night-coverage at [email or web address], password [email or web address] 12/03/2014, 9:01 AM  LOS: 2 days

## 2014-12-03 NOTE — Progress Notes (Signed)
Patient's 1700 blood sugar 131. Patient ordered to receive 18 units of 70/30 insulin and Novolog at 1800. MD called due to concerns for hypoglycemia if both insulins given concurrently. RN instructed by MD to administer 70/30 and hold dinner coverage of regular insulin.

## 2014-12-03 NOTE — Progress Notes (Signed)
ANTIBIOTIC CONSULT NOTE -follow up Pharmacy Consult for Vancomycin and Zosyn  Indication: cellulitis  No Known Allergies  Patient Measurements: Height: 5\' 6"  (167.6 cm) Weight: 152 lb 8.9 oz (69.2 kg) IBW/kg (Calculated) : 63.8  Vital Signs: Temp: 98.3 F (36.8 C) (05/03 1435) Temp Source: Oral (05/03 1435) BP: 151/80 mmHg (05/03 1435) Pulse Rate: 85 (05/03 1435) Intake/Output from previous day: 05/02 0701 - 05/03 0700 In: 128 [IV Piggyback:128] Out: 1225 [Urine:1225] Intake/Output from this shift: Total I/O In: 480 [P.O.:480] Out: 1050 [Urine:1050]  Labs:  Recent Labs  12/01/14 1930 12/02/14 0453 12/03/14 0445  WBC 17.1* 16.0* 12.7*  HGB 12.3* 11.7* 10.5*  PLT 229 220 211  CREATININE 1.57* 1.52* 1.69*   Estimated Creatinine Clearance: 50.9 mL/min (by C-G formula based on Cr of 1.69). No results for input(s): VANCOTROUGH, VANCOPEAK, VANCORANDOM, GENTTROUGH, GENTPEAK, GENTRANDOM, TOBRATROUGH, TOBRAPEAK, TOBRARND, AMIKACINPEAK, AMIKACINTROU, AMIKACIN in the last 72 hours.   Microbiology: Recent Results (from the past 720 hour(s))  Culture, blood (routine x 2)     Status: None (Preliminary result)   Collection Time: 12/01/14 10:00 PM  Result Value Ref Range Status   Specimen Description BLOOD RIGHT HAND  Final   Special Requests   Final    BOTTLES DRAWN AEROBIC AND ANAEROBIC 8CC EA PT ON ZOSYN,VANCOMYCIN   Culture   Final           BLOOD CULTURE RECEIVED NO GROWTH TO DATE CULTURE WILL BE HELD FOR 5 DAYS BEFORE ISSUING A FINAL NEGATIVE REPORT Performed at [facility]    Report Status PENDING  Incomplete  Culture, blood (routine x 2)     Status: None (Preliminary result)   Collection Time: 12/01/14 10:09 PM  Result Value Ref Range Status   Specimen Description BLOOD LEFT ARM  Final   Special Requests   Final    BOTTLES DRAWN AEROBIC ONLY 10CC PT ON ZOSYN,VANCOMYCIN   Culture   Final           BLOOD CULTURE RECEIVED NO GROWTH TO DATE CULTURE WILL BE  HELD FOR 5 DAYS BEFORE ISSUING A FINAL NEGATIVE REPORT Note: Culture results may be compromised due to an excessive volume of blood received in culture bottles. Performed at [facility]    Report Status PENDING  Incomplete    Medical History: Past Medical History  Diagnosis Date  . Diabetes mellitus with neuropathy   . Hypertension     Medications:  Prescriptions prior to admission  Medication Sig Dispense Refill Last Dose  . acetaminophen (TYLENOL) 325 MG tablet Take 325 mg by mouth every 6 (six) hours as needed for mild pain or moderate pain.   unknown  . amLODipine (NORVASC) 10 MG tablet Take 10 mg by mouth daily.   unknown  . gabapentin (NEURONTIN) 100 MG capsule Take 100 mg by mouth 2 (two) times daily.   unknown  . insulin NPH-regular Human (NOVOLIN 70/30) (70-30) 100 UNIT/ML injection Inject 25 Units into the skin 2 (two) times daily with a meal.   unknown  . lisinopril (PRINIVIL,ZESTRIL) 40 MG tablet Take 40 mg by mouth daily.   unknown   Assessment: 44 yo M with DM.  On Vancomycin and Zosyn D#2 for cellulitis R foot, WBC 16>12.7, creat 1.52>1.69, AF, if pt clinically deteriorates, MD plans to order MRI R foot. Pt slowly improving.   Vanc 5/1>> Zosyn 5/1>>  5/1 BCx2>> ngtd   Goal of Therapy:  Vancomycin trough level 10-15 mcg/ml  Plan:   Plan:  decrease vancomycin 1 gm q12h to vancomycin 750 q12h with creat 1.69 Continue Zosyn 3.375 g IV q8h   F/u plans, anticipate change to PO abx soon  Herby Abraham, Pharm.D. [phone] 12/03/2014 3:13 PM

## 2014-12-03 NOTE — Progress Notes (Signed)
Hypoglycemic Event  CBG: 67  Treatment: 15 GM carbohydrate snack  Symptoms: Pale and Shaky  Follow-up CBG: Time:2228 CBG Result:64  Possible Reasons for Event: Unknown  Comments/MD notified:K. Black    Juan Werner B  Remember to initiate Hypoglycemia Order Set & complete

## 2014-12-04 ENCOUNTER — Inpatient Hospital Stay (HOSPITAL_COMMUNITY): Payer: Self-pay

## 2014-12-04 DIAGNOSIS — N185 Chronic kidney disease, stage 5: Secondary | ICD-10-CM

## 2014-12-04 LAB — GLUCOSE, CAPILLARY
GLUCOSE-CAPILLARY: 106 mg/dL — AB (ref 70–99)
GLUCOSE-CAPILLARY: 131 mg/dL — AB (ref 70–99)
Glucose-Capillary: 133 mg/dL — ABNORMAL HIGH (ref 70–99)
Glucose-Capillary: 107 mg/dL — ABNORMAL HIGH (ref 70–99)

## 2014-12-04 MED ORDER — SODIUM CHLORIDE 0.9 % IV BOLUS (SEPSIS)
500.0000 mL | Freq: Once | INTRAVENOUS | Status: AC
  Administered 2014-12-05: 500 mL via INTRAVENOUS

## 2014-12-04 MED ORDER — SODIUM CHLORIDE 0.9 % IJ SOLN
10.0000 mL | INTRAMUSCULAR | Status: DC | PRN
  Administered 2014-12-07 – 2014-12-10 (×3): 10 mL
  Filled 2014-12-04 (×3): qty 40

## 2014-12-04 NOTE — Evaluation (Signed)
Physical Therapy Evaluation Patient Details Name: Juan Werner MRN: [id number] DOB: 04/20/1971 Today's Date: 12/04/2014   History of Present Illness  Cellulits of Right foot due to stepping on nail while at work  Clinical Impression  Patient mobilizing around room with RW without difficulty. Patient also demonstrates good balance on single leg. Demonstrates good compliance with NWBing. Unable to use crutches secondary to picc line placement. No further acute PT needs will sign off.    Follow Up Recommendations No PT follow up    Equipment Recommendations  Rolling walker with 5" wheels    Recommendations for Other Services       Precautions / Restrictions Precautions Precautions: None Restrictions Weight Bearing Restrictions: Yes Other Position/Activity Restrictions: NWB'ing RLE per pt per MD      Mobility  Bed Mobility Overal bed mobility: Modified Independent                Transfers Overall transfer level: Modified independent Equipment used: Rolling walker (2 wheeled)                Ambulation/Gait Ambulation/Gait assistance: Modified independent (Device/Increase time) Ambulation Distance (Feet): 30 Feet Assistive device: Rolling walker (2 wheeled) Gait Pattern/deviations: Step-to pattern     General Gait Details: step to pattern with no physical assist, ujnable to use crutches at this time secondary to PICC line  Stairs            Wheelchair Mobility    Modified Rankin (Stroke Patients Only)       Balance Overall balance assessment: No apparent balance deficits (not formally assessed) (pt demonstrates SLS for several seconds without difficulty)                                           Pertinent Vitals/Pain Pain Assessment: No/denies pain    Home Living Family/patient expects to be discharged to:: Private residence Living Arrangements:  (with others) Available Help at Discharge: Available PRN/intermittently  (others) Type of Home: House Home Access: Level entry     Home Layout: Two level;Able to live on main level with bedroom/bathroom Home Equipment: None      Prior Function Level of Independence: Independent               Hand Dominance   Dominant Hand: Right    Extremity/Trunk Assessment   Upper Extremity Assessment: Overall WFL for tasks assessed           Lower Extremity Assessment: RLE deficits/detail         Communication   Communication: Prefers language other than English (Spainish)  Cognition Arousal/Alertness: Awake/alert Behavior During Therapy: WFL for tasks assessed/performed Overall Cognitive Status: Within Functional Limits for tasks assessed                      General Comments      Exercises        Assessment/Plan    PT Assessment Patent does not need any further PT services  PT Diagnosis Difficulty walking   PT Problem List    PT Treatment Interventions     PT Goals (Current goals can be found in the Care Plan section) Acute Rehab PT Goals Patient Stated Goal: home soon PT Goal Formulation: All assessment and education complete, DC therapy    Frequency     Barriers to discharge  Co-evaluation               End of Session Equipment Utilized During Treatment: Gait belt Activity Tolerance: Patient tolerated treatment well Patient left: in bed;with call bell/phone within reach Nurse Communication: Mobility status         Time: [id number] PT Time Calculation (min) (ACUTE ONLY): 11 min   Charges:   PT Evaluation $Initial PT Evaluation Tier I: 1 Procedure     PT G CodesFabio Werner:        Juan Werner 12/04/2014, 5:33 PM Juan Werner, PT DPT  [phone]

## 2014-12-04 NOTE — Progress Notes (Signed)
Peripherally Inserted Central Catheter/Midline Placement  The IV Nurse has discussed with the patient and/or persons authorized to consent for the patient, the purpose of this procedure and the potential benefits and risks involved with this procedure.  The benefits include less needle sticks, lab draws from the catheter and patient may be discharged home with the catheter.  Risks include, but not limited to, infection, bleeding, blood clot (thrombus formation), and puncture of an artery; nerve damage and irregular heat beat.  Alternatives to this procedure were also discussed.  PICC/Midline Placement Documentation  PICC / Midline Single Lumen 12/04/14 PICC Right Basilic 38 cm 1 cm (Active)  Indication for Insertion or Continuance of Line Poor Vasculature-patient has had multiple peripheral attempts or PIVs lasting less than 24 hours 12/04/2014  4:24 PM  Exposed Catheter (cm) 1 cm 12/04/2014  4:24 PM  Dressing Change Due 12/11/14 12/04/2014  4:24 PM       Stacie GlazeJoyce, Zaidyn Claire Horton 12/04/2014, 4:24 PM

## 2014-12-04 NOTE — Evaluation (Signed)
  Occupational Therapy Evaluation and Discharge Patient Details Name: Juan Werner MRN: [id number] DOB: 02/27/1971 Today's Date: 12/04/2014    History of Present Illness Cellulits of Right foot due to stepping on nail while at work   Clinical Impression   This 44 yo male admitted with above presents to acute OT at a Mod I level. No further needs we will sign off.    Follow Up Recommendations  No OT follow up    Equipment Recommendations  None recommended by OT       Precautions / Restrictions Precautions Precautions: None Restrictions Weight Bearing Restrictions: Yes Other Position/Activity Restrictions: NWB'ing RLE per pt per MD      Mobility Bed Mobility Overal bed mobility: Modified Independent                Transfers Overall transfer level: Modified independent Equipment used: Rolling walker (2 wheeled)                       ADL Overall ADL's : Modified independent                                             Vision Additional Comments: No issues          Pertinent Vitals/Pain Pain Assessment: No/denies pain Pain Score: 0-No pain     Hand Dominance Right   Extremity/Trunk Assessment Upper Extremity Assessment Upper Extremity Assessment: Overall WFL for tasks assessed           Communication Communication Communication: Prefers language other than English (Spainish)   Cognition Arousal/Alertness: Awake/alert Behavior During Therapy: WFL for tasks assessed/performed Overall Cognitive Status: Within Functional Limits for tasks assessed                                Home Living Family/patient expects to be discharged to:: Private residence Living Arrangements:  (with others) Available Help at Discharge: Available PRN/intermittently (others) Type of Home: House Home Access: Level entry     Home Layout: Two level;Able to live on main level with bedroom/bathroom     Bathroom Shower/Tub:  Walk-in shower;Door   Foot LockerBathroom Toilet: Standard     Home Equipment: None          Prior Functioning/Environment Level of Independence: Independent             OT Diagnosis: Generalized weakness         OT Goals(Current goals can be found in the care plan section) Acute Rehab OT Goals Patient Stated Goal: home soon  OT Frequency:                End of Session Equipment Utilized During Treatment: Rolling walker  Activity Tolerance: Patient tolerated treatment well Patient left: in bed;with call bell/phone within reach   Time: 1046-1101 OT Time Calculation (min): 15 min Charges:  OT General Charges $OT Visit: 1 Procedure OT Evaluation $Initial OT Evaluation Tier I: 1 Procedure  Evette GeorgesLeonard, Renea Schoonmaker Eva [phone] 12/04/2014, 11:17 AM

## 2014-12-04 NOTE — Care Management Note (Signed)
Case Management Note  Patient Details  Name: Juan Werner MRN: [id number] Date of Birth: 09/06/1970   UR updated . Ronny FlurryHeather Jadira Nierman RN BSN   Subjective/Objective:                    Action/Plan:   Expected Discharge Date:                  Expected Discharge Plan:     In-House Referral:     Discharge planning Services     Post Acute Care Choice:    Choice offered to:     DME Arranged:    DME Agency:     HH Arranged:    HH Agency:     Status of Service:     Medicare Important Message Given:    Date Medicare IM Given:    Medicare IM give by:    Date Additional Medicare IM Given:    Additional Medicare Important Message give by:     If discussed at Long Length of Stay Meetings, dates discussed:    Additional Comments:  [name], Sunita Demond Marie, RN 12/04/2014, 3:18 PM

## 2014-12-04 NOTE — Progress Notes (Signed)
Patient spiked temp of 103. Medicated him with 2 Vicodin. Patient is on Vanc and Zosyn and had blood cultures drawn on 5/1.  Text paged K.Schorr on call for [facility].

## 2014-12-04 NOTE — Progress Notes (Signed)
[facility] PROGRESS NOTE  Juan Werner UKG:[id number] DOB: Feb 19, 1971 DOA: 12/01/2014 PCP: No primary care provider on file.  Assessment/Plan:  Cellulitis of the right foot -following puncture wound -on day 3 of abx, ESR at 125 -will check MRI due to persistent fevers and extension of area of erythema, r/o abscess -Continue broad-spectrum antibiotics, follow blood culture -Mobilize, PT/OT eval  Hypertension Continue ACE inhibitor, Norvasc  Diabetes mellitus -hbA1c 8.6 -continue insulin 70/30 to 18 units twice a day , sliding scale insulin  Peripheral neuropathy Continue gabapentin  Chronic kidney disease stage III Creatinine unchanged, likely at baseline, baseline is not available in our system  Nausea Likely due to constipation, narcotics  resolved  Code Status: full Family Communication: none at bedside Disposition Plan:  Home when improved    Brief narrative: Juan Werner is a 44 y.o. male with Past medical history of diabetes mellitus hypertension and neuropathy. The patient is presenting with complaints of foot pain on the right. He mentions that 2 days ago while at work he placed his leg on a nail and had injury. After that he had progressively worsening foot pain. He has swelling of his foot with redness and worsening swelling. Therefore he came to the hospital. He has bilateral burning pain of his foot which is chronic. He denies any fever but 5 chills. Does not have any shortness of breath or cough does not have any nausea or vomiting no complaints of abdominal pain. No abdominal pain diarrhea nausea vomiting.  The patient is coming from home And at his baseline independent for most of his ADL.  Consultants:  None  Procedures:  None  Antibiotics: Vancomycin/Zosyn   HPI/Subjective: With persistent pain and erythema  Objective: Filed Vitals:   12/03/14 1028 12/03/14 1435 12/03/14 2150 12/04/14 0604  BP: 132/69 151/80 152/79 165/85   Pulse: 72 85 89 90  Temp:  98.3 F (36.8 C) 100.3 F (37.9 C) 100 F (37.8 C)  TempSrc:  Oral Oral Oral  Resp: $Remo'18 18 17 17  'SBWYi$ Height:      Weight:      SpO2: 98% 98% 98% 99%    Intake/Output Summary (Last 24 hours) at 12/04/14 1241 Last data filed at 12/04/14 0603  Gross per 24 hour  Intake    240 ml  Output   1925 ml  Net  -1685 ml    Exam:  General: No acute respiratory distress, AAOx3 Lungs: Clear to auscultation bilaterally without wheezes or crackles Cardiovascular: Regular rate and rhythm without murmur gallop or rub normal S1 and S2 Abdomen: Nontender, nondistended, soft, bowel sounds positive, no rebound, no ascites, no appreciable mass Extremities: No significant cyanosis, clubbing, or edema bilateral lower extremities Skin: erythema and swelling of R foot beyond the area that was demarcated,  puncture wound on dorsum of the right foot    Data Reviewed: Basic Metabolic Panel:  Recent Labs Lab 12/01/14 1930 12/02/14 0453 12/03/14 0445  NA 136 135 136  K 4.3 4.5 4.4  CL 104 104 104  CO2 $Re'24 23 24  'OOf$ GLUCOSE 208* 231* 162*  BUN $Re'17 16 19  'XsF$ CREATININE 1.57* 1.52* 1.69*  CALCIUM 8.8* 8.3* 8.5*    Liver Function Tests:  Recent Labs Lab 12/02/14 0453 12/03/14 0445  AST 13* 12*  ALT 12* 10*  ALKPHOS 58 53  BILITOT 0.5 0.5  PROT 5.9* 5.6*  ALBUMIN 2.2* 2.0*   No results for input(s): LIPASE, AMYLASE in the last 168 hours. No results for input(s): AMMONIA in the  last 168 hours.  CBC:  Recent Labs Lab 12/01/14 1930 12/02/14 0453 12/03/14 0445  WBC 17.1* 16.0* 12.7*  NEUTROABS 11.9*  --   --   HGB 12.3* 11.7* 10.5*  HCT 36.4* 35.2* 30.9*  MCV 85.8 85.9 86.8  PLT 229 220 211    Cardiac Enzymes:  Recent Labs Lab 12/01/14 2210  CKTOTAL 105   BNP (last 3 results) No results for input(s): BNP in the last 8760 hours.  ProBNP (last 3 results) No results for input(s): PROBNP in the last 8760 hours.    CBG:  Recent Labs Lab  12/03/14 1714 12/03/14 2156 12/03/14 2241 12/04/14 0823 12/04/14 1239  GLUCAP 131* 68* 89 133* 107*    Recent Results (from the past 240 hour(s))  Culture, blood (routine x 2)     Status: None (Preliminary result)   Collection Time: 12/01/14 10:00 PM  Result Value Ref Range Status   Specimen Description BLOOD RIGHT HAND  Final   Special Requests   Final    BOTTLES DRAWN AEROBIC AND ANAEROBIC 8CC EA PT ON ZOSYN,VANCOMYCIN   Culture   Final           BLOOD CULTURE RECEIVED NO GROWTH TO DATE CULTURE WILL BE HELD FOR 5 DAYS BEFORE ISSUING A FINAL NEGATIVE REPORT Performed at [facility]    Report Status PENDING  Incomplete  Culture, blood (routine x 2)     Status: None (Preliminary result)   Collection Time: 12/01/14 10:09 PM  Result Value Ref Range Status   Specimen Description BLOOD LEFT ARM  Final   Special Requests   Final    BOTTLES DRAWN AEROBIC ONLY 10CC PT ON ZOSYN,VANCOMYCIN   Culture   Final           BLOOD CULTURE RECEIVED NO GROWTH TO DATE CULTURE WILL BE HELD FOR 5 DAYS BEFORE ISSUING A FINAL NEGATIVE REPORT Note: Culture results may be compromised due to an excessive volume of blood received in culture bottles. Performed at [facility]    Report Status PENDING  Incomplete     Studies: Dg Abd 1 View  12/03/2014   CLINICAL DATA:  Patient with nausea and vomiting.  EXAM: ABDOMEN - 1 VIEW  COMPARISON:  None.  FINDINGS: Gas is demonstrated within non dilated loops of large and small bowel in a nonobstructed pattern. Supine evaluation limited for the detection of free intraperitoneal air. Large amount of stool within the transverse and descending colon. Unremarkable osseous skeleton.  IMPRESSION: Nonobstructed bowel gas pattern.   Electronically Signed   By: Lovey Newcomer M.D.   On: 12/03/2014 12:50   Dg Foot Complete Right  12/01/2014   CLINICAL DATA:  Right foot pain stepped on nail 3 days ago redness swelling around foot cellulitis puncture wound lateral  aspect first toe  EXAM: RIGHT FOOT COMPLETE - 3+ VIEW  COMPARISON:  None.  FINDINGS: Soft tissue swelling first toe. No fracture or dislocation. No radiodense foreign body. No evidence of periosteal reaction or osteomyelitis.  IMPRESSION: Soft tissue swelling suggesting cellulitis.   Electronically Signed   By: Skipper Cliche M.D.   On: 12/01/2014 20:18    Scheduled Meds: . amLODipine  10 mg Oral Daily  . gabapentin  100 mg Oral BID  . heparin  5,000 Units Subcutaneous 3 times per day  . insulin aspart  0-15 Units Subcutaneous TID WC  . insulin aspart  0-5 Units Subcutaneous QHS  . insulin aspart protamine- aspart  18 Units Subcutaneous  BID WC  . lisinopril  40 mg Oral Daily  . piperacillin-tazobactam (ZOSYN)  IV  3.375 g Intravenous 3 times per day  . vancomycin  750 mg Intravenous Q12H   Continuous Infusions:    Principal Problem:   Cellulitis of right foot Active Problems:   Neuropathic foot ulcer   Essential hypertension   Diabetic neuropathy   CKD (chronic kidney disease)    Time spent: 25 minutes   [facility] Pager [phone]. If 7PM-7AM, please contact night-coverage at [email or web address], password [facility] 12/04/2014, 12:41 PM  LOS: 3 days

## 2014-12-05 ENCOUNTER — Inpatient Hospital Stay (HOSPITAL_COMMUNITY): Payer: MEDICAID | Admitting: Certified Registered"

## 2014-12-05 ENCOUNTER — Encounter (HOSPITAL_COMMUNITY)
Admission: EM | Disposition: A | Discharge status: Home / Self Care | Payer: Self-pay | Source: Home / Self Care | Attending: Internal Medicine

## 2014-12-05 ENCOUNTER — Inpatient Hospital Stay (HOSPITAL_COMMUNITY): Payer: Self-pay | Admitting: Certified Registered"

## 2014-12-05 ENCOUNTER — Encounter (HOSPITAL_COMMUNITY): Payer: Self-pay | Admitting: Certified Registered"

## 2014-12-05 HISTORY — PX: I & D EXTREMITY: SHX5045

## 2014-12-05 LAB — CBC
HCT: 28.7 % — ABNORMAL LOW (ref 39.0–52.0)
Hemoglobin: 9.6 g/dL — ABNORMAL LOW (ref 13.0–17.0)
MCH: 28.5 pg (ref 26.0–34.0)
MCHC: 33.4 g/dL (ref 30.0–36.0)
MCV: 85.2 fL (ref 78.0–100.0)
Platelets: 270 10*3/uL (ref 150–400)
RBC: 3.37 MIL/uL — ABNORMAL LOW (ref 4.22–5.81)
RDW: 12.2 % (ref 11.5–15.5)
WBC: 16.1 10*3/uL — ABNORMAL HIGH (ref 4.0–10.5)

## 2014-12-05 LAB — BASIC METABOLIC PANEL
Anion gap: 8 (ref 5–15)
BUN: 17 mg/dL (ref 6–20)
CO2: 25 mmol/L (ref 22–32)
Calcium: 8.3 mg/dL — ABNORMAL LOW (ref 8.9–10.3)
Chloride: 103 mmol/L (ref 101–111)
Creatinine, Ser: 1.79 mg/dL — ABNORMAL HIGH (ref 0.61–1.24)
GFR calc Af Amer: 52 mL/min — ABNORMAL LOW (ref >60)
GFR calc non Af Amer: 45 mL/min — ABNORMAL LOW (ref >60)
Glucose, Bld: 96 mg/dL (ref 70–99)
POTASSIUM: 4.2 mmol/L (ref 3.5–5.1)
Sodium: 136 mmol/L (ref 135–145)

## 2014-12-05 LAB — SURGICAL PCR SCREEN
MRSA, PCR: NEGATIVE
Staphylococcus aureus: POSITIVE — AB

## 2014-12-05 LAB — GLUCOSE, CAPILLARY
Glucose-Capillary: 160 mg/dL — ABNORMAL HIGH (ref 70–99)
Glucose-Capillary: 84 mg/dL (ref 70–99)
Glucose-Capillary: 77 mg/dL (ref 70–99)
Glucose-Capillary: 85 mg/dL (ref 70–99)
Glucose-Capillary: 94 mg/dL (ref 70–99)

## 2014-12-05 SURGERY — IRRIGATION AND DEBRIDEMENT EXTREMITY
Anesthesia: General | Site: Foot | Laterality: Right

## 2014-12-05 MED ORDER — FENTANYL CITRATE (PF) 250 MCG/5ML IJ SOLN
INTRAMUSCULAR | Status: DC | PRN
  Administered 2014-12-05: 50 ug via INTRAVENOUS

## 2014-12-05 MED ORDER — SUFENTANIL CITRATE 50 MCG/ML IV SOLN: INTRAVENOUS | Status: DC | PRN

## 2014-12-05 MED ORDER — FENTANYL CITRATE (PF) 250 MCG/5ML IJ SOLN
INTRAMUSCULAR | Status: AC
  Filled 2014-12-05: qty 5

## 2014-12-05 MED ORDER — PROPOFOL 10 MG/ML IV BOLUS
INTRAVENOUS | Status: DC | PRN
  Administered 2014-12-05: 200 mg via INTRAVENOUS

## 2014-12-05 MED ORDER — LACTATED RINGERS IV SOLN
INTRAVENOUS | Status: DC | PRN
Start: 2014-12-05 — End: 2014-12-05
  Administered 2014-12-05: 20:00:00 via INTRAVENOUS

## 2014-12-05 MED ORDER — SODIUM CHLORIDE 0.9 % IR SOLN
Status: DC | PRN
  Administered 2014-12-05: 1000 mL

## 2014-12-05 MED ORDER — MIDAZOLAM HCL 2 MG/2ML IJ SOLN
INTRAMUSCULAR | Status: AC
  Filled 2014-12-05: qty 2

## 2014-12-05 MED ORDER — SUCCINYLCHOLINE CHLORIDE 20 MG/ML IJ SOLN
INTRAMUSCULAR | Status: DC | PRN
  Administered 2014-12-05: 180 mg via INTRAVENOUS

## 2014-12-05 MED ORDER — SODIUM CHLORIDE 0.9 % IV SOLN
1250.0000 mg | INTRAVENOUS | Status: DC
  Administered 2014-12-05 – 2014-12-07 (×3): 1250 mg via INTRAVENOUS
  Filled 2014-12-05 (×3): qty 1250

## 2014-12-05 MED ORDER — HYDROMORPHONE HCL 1 MG/ML IJ SOLN: 0.2500 mg | INTRAMUSCULAR | Status: DC | PRN

## 2014-12-05 MED ORDER — MIDAZOLAM HCL 5 MG/5ML IJ SOLN
INTRAMUSCULAR | Status: DC | PRN
  Administered 2014-12-05: 2 mg via INTRAVENOUS

## 2014-12-05 MED ORDER — DEXTROSE-NACL 5-0.9 % IV SOLN
INTRAVENOUS | Status: AC
  Administered 2014-12-05 (×2): via INTRAVENOUS

## 2014-12-05 MED ORDER — PROPOFOL 10 MG/ML IV BOLUS
INTRAVENOUS | Status: AC
  Filled 2014-12-05: qty 20

## 2014-12-05 MED ORDER — LIDOCAINE HCL (CARDIAC) 20 MG/ML IV SOLN
INTRAVENOUS | Status: AC
  Filled 2014-12-05: qty 5

## 2014-12-05 MED ORDER — ONDANSETRON HCL 4 MG/2ML IJ SOLN
INTRAMUSCULAR | Status: AC
  Filled 2014-12-05: qty 2

## 2014-12-05 MED ORDER — SUCCINYLCHOLINE CHLORIDE 20 MG/ML IJ SOLN
INTRAMUSCULAR | Status: AC
  Filled 2014-12-05: qty 1

## 2014-12-05 MED ORDER — PROMETHAZINE HCL 25 MG/ML IJ SOLN: 6.2500 mg | INTRAMUSCULAR | Status: DC | PRN

## 2014-12-05 SURGICAL SUPPLY — 50 items
BAG DECANTER FOR FLEXI CONT (MISCELLANEOUS) IMPLANT
BANDAGE ELASTIC 4 VELCRO ST LF (GAUZE/BANDAGES/DRESSINGS) ×6 IMPLANT
BANDAGE ELASTIC 6 VELCRO ST LF (GAUZE/BANDAGES/DRESSINGS) ×3 IMPLANT
BANDAGE ESMARK 6X9 LF (GAUZE/BANDAGES/DRESSINGS) IMPLANT
BNDG COHESIVE 4X5 TAN STRL (GAUZE/BANDAGES/DRESSINGS) ×3 IMPLANT
BNDG ESMARK 6X9 LF (GAUZE/BANDAGES/DRESSINGS)
BNDG GAUZE ELAST 4 BULKY (GAUZE/BANDAGES/DRESSINGS) ×6 IMPLANT
CUFF TOURNIQUET SINGLE 18IN (TOURNIQUET CUFF) ×3 IMPLANT
CUFF TOURNIQUET SINGLE 24IN (TOURNIQUET CUFF) IMPLANT
CUFF TOURNIQUET SINGLE 34IN LL (TOURNIQUET CUFF) IMPLANT
CUFF TOURNIQUET SINGLE 44IN (TOURNIQUET CUFF) IMPLANT
DRAPE U-SHAPE 47X51 STRL (DRAPES) ×3 IMPLANT
DRSG ADAPTIC 3X8 NADH LF (GAUZE/BANDAGES/DRESSINGS) ×3 IMPLANT
DRSG EMULSION OIL 3X3 NADH (GAUZE/BANDAGES/DRESSINGS) ×3 IMPLANT
DRSG PAD ABDOMINAL 8X10 ST (GAUZE/BANDAGES/DRESSINGS) ×3 IMPLANT
DURAPREP 26ML APPLICATOR (WOUND CARE) ×3 IMPLANT
ELECT CAUTERY BLADE 6.4 (BLADE) IMPLANT
ELECT REM PT RETURN 9FT ADLT (ELECTROSURGICAL)
ELECTRODE REM PT RTRN 9FT ADLT (ELECTROSURGICAL) IMPLANT
GAUZE SPONGE 4X4 12PLY STRL (GAUZE/BANDAGES/DRESSINGS) ×3 IMPLANT
GAUZE XEROFORM 1X8 LF (GAUZE/BANDAGES/DRESSINGS) ×3 IMPLANT
GLOVE BIOGEL PI IND STRL 8 (GLOVE) ×2 IMPLANT
GLOVE BIOGEL PI INDICATOR 8 (GLOVE) ×4
GLOVE ECLIPSE 7.5 STRL STRAW (GLOVE) ×6 IMPLANT
GOWN STRL REUS W/ TWL LRG LVL3 (GOWN DISPOSABLE) ×2 IMPLANT
GOWN STRL REUS W/ TWL XL LVL3 (GOWN DISPOSABLE) ×2 IMPLANT
GOWN STRL REUS W/TWL LRG LVL3 (GOWN DISPOSABLE) ×4
GOWN STRL REUS W/TWL XL LVL3 (GOWN DISPOSABLE) ×4
HANDPIECE INTERPULSE COAX TIP (DISPOSABLE)
KIT BASIN OR (CUSTOM PROCEDURE TRAY) ×3 IMPLANT
KIT ROOM TURNOVER OR (KITS) ×3 IMPLANT
MANIFOLD NEPTUNE II (INSTRUMENTS) ×3 IMPLANT
NS IRRIG 1000ML POUR BTL (IV SOLUTION) ×3 IMPLANT
PACK ORTHO EXTREMITY (CUSTOM PROCEDURE TRAY) ×3 IMPLANT
PAD ABD 8X10 STRL (GAUZE/BANDAGES/DRESSINGS) ×3 IMPLANT
PAD ARMBOARD 7.5X6 YLW CONV (MISCELLANEOUS) ×6 IMPLANT
PAD CAST 4YDX4 CTTN HI CHSV (CAST SUPPLIES) ×1 IMPLANT
PADDING CAST COTTON 4X4 STRL (CAST SUPPLIES) ×2
SET HNDPC FAN SPRY TIP SCT (DISPOSABLE) IMPLANT
SPONGE GAUZE 4X4 12PLY STER LF (GAUZE/BANDAGES/DRESSINGS) ×3 IMPLANT
SPONGE LAP 18X18 X RAY DECT (DISPOSABLE) ×3 IMPLANT
STOCKINETTE IMPERVIOUS 9X36 MD (GAUZE/BANDAGES/DRESSINGS) ×3 IMPLANT
TOWEL OR 17X24 6PK STRL BLUE (TOWEL DISPOSABLE) ×3 IMPLANT
TOWEL OR 17X26 10 PK STRL BLUE (TOWEL DISPOSABLE) ×3 IMPLANT
TUBE ANAEROBIC SPECIMEN COL (MISCELLANEOUS) IMPLANT
TUBE CONNECTING 12'X1/4 (SUCTIONS) ×1
TUBE CONNECTING 12X1/4 (SUCTIONS) ×2 IMPLANT
UNDERPAD 30X30 INCONTINENT (UNDERPADS AND DIAPERS) ×3 IMPLANT
WATER STERILE IRR 1000ML POUR (IV SOLUTION) ×3 IMPLANT
YANKAUER SUCT BULB TIP NO VENT (SUCTIONS) ×3 IMPLANT

## 2014-12-05 NOTE — Progress Notes (Signed)
[facility] PROGRESS NOTE  Juan Werner ZGY:[id number] DOB: 04-Jul-1971 DOA: 12/01/2014 PCP: No primary care provider on file.  Assessment/Plan:  Cellulitis of the right foot with early phlegmon vs abscess -following puncture wound -on day 4 of abx, ESR at 125, persistent fevers -MRI done due to persistent fevers and extension of area of erythema, shows possible subcut early abscess, no deep abscess or osteo, will ask Ortho to eval -Continue broad-spectrum antibiotics, blood culture NGTD -Mobilize, PT/OT eval  Hypertension Continue ACE inhibitor, Norvasc  Diabetes mellitus -hbA1c 8.6 -continue insulin 70/30 to 18 units twice a day , sliding scale insulin  Peripheral neuropathy Continue gabapentin  Chronic kidney disease stage III Creatinine unchanged, likely at baseline, baseline is not available in our system  Nausea Likely due to constipation, narcotics  resolved  Code Status: full Family Communication: none at bedside Disposition Plan:  Home when improved    Brief narrative: Juan Werner is a 44 y.o. male with Past medical history of diabetes mellitus hypertension and neuropathy. The patient is presenting with complaints of foot pain on the right. He mentions that 2 days ago while at work he placed his leg on a nail and had injury. After that he had progressively worsening foot pain. He has swelling of his foot with redness and worsening swelling. Therefore he came to the hospital. He has bilateral burning pain of his foot which is chronic. He denies any fever but 5 chills. Does not have any shortness of breath or cough does not have any nausea or vomiting no complaints of abdominal pain. No abdominal pain diarrhea nausea vomiting.  The patient is coming from home And at his baseline independent for most of his ADL.  Consultants:  None  Procedures:  None  Antibiotics: Vancomycin/Zosyn   HPI/Subjective: Fevers overnight, upto 103, reportedly  pain improving  Objective: Filed Vitals:   12/04/14 2135 12/05/14 0014 12/05/14 0510 12/05/14 1045  BP: 170/82  157/85 132/65  Pulse: 113  92 81  Temp: 103 F (39.4 C) 101.1 F (38.4 C) 99 F (37.2 C) 98.4 F (36.9 C)  TempSrc: Oral  Oral   Resp: _0 Height:      Weight:      SpO2: 100%  100% 99%    Intake/Output Summary (Last 24 hours) at 12/05/14 1053 Last data filed at 12/05/14 0511  Gross Juan 24 hour  Intake   1500 ml  Output   1050 ml  Net    450 ml    Exam:  General: No acute respiratory distress, AAOx3 Lungs: Clear to auscultation bilaterally without wheezes or crackles Cardiovascular: Regular rate and rhythm without murmur gallop or rub normal S1 and S2 Abdomen: Nontender, nondistended, soft, bowel sounds positive, no rebound, no ascites, no appreciable mass Extremities: No significant cyanosis, clubbing, or edema bilateral lower extremities Skin: erythema and swelling of R foot beyond the area that was demarcated,  puncture wound on dorsum of the right foot    Data Reviewed: Basic Metabolic Panel:  Recent Labs Lab 12/01/14 1930 12/02/14 0453 12/03/14 0445 12/05/14 0452  NA 136 135 136 136  K 4.3 4.5 4.4 4.2  CL 104 104 104 103  CO2 _1 GLUCOSE 208* 231* 162* 96  BUN _2 CREATININE 1.57* 1.52* 1.69* 1.79*  CALCIUM 8.8* 8.3* 8.5* 8.3*    Liver Function Tests:  Recent Labs Lab 12/02/14 0453 12/03/14 0445  AST 13* 12*  ALT 12* 10*  ALKPHOS 58 53  BILITOT 0.5 0.5  PROT 5.9* 5.6*  ALBUMIN 2.2* 2.0*   No results for input(s): LIPASE, AMYLASE in the last 168 hours. No results for input(s): AMMONIA in the last 168 hours.  CBC:  Recent Labs Lab 12/01/14 1930 12/02/14 0453 12/03/14 0445 12/05/14 0452  WBC 17.1* 16.0* 12.7* 16.1*  NEUTROABS 11.9*  --   --   --   HGB 12.3* 11.7* 10.5* 9.6*  HCT 36.4* 35.2* 30.9* 28.7*  MCV 85.8 85.9 86.8 85.2  PLT 229 220 211 270    Cardiac Enzymes:  Recent Labs Lab  12/01/14 2210  CKTOTAL 105   BNP (last 3 results) No results for input(s): BNP in the last 8760 hours.  ProBNP (last 3 results) No results for input(s): PROBNP in the last 8760 hours.    CBG:  Recent Labs Lab 12/04/14 0823 12/04/14 1239 12/04/14 1620 12/04/14 2131 12/05/14 0843  GLUCAP 133* 107* 106* 131* 160*    Recent Results (from the past 240 hour(s))  Culture, blood (routine x 2)     Status: None (Preliminary result)   Collection Time: 12/01/14 10:00 PM  Result Value Ref Range Status   Specimen Description BLOOD RIGHT HAND  Final   Special Requests   Final    BOTTLES DRAWN AEROBIC AND ANAEROBIC 8CC EA PT ON ZOSYN,VANCOMYCIN   Culture   Final           BLOOD CULTURE RECEIVED NO GROWTH TO DATE CULTURE WILL BE HELD FOR 5 DAYS BEFORE ISSUING A FINAL NEGATIVE REPORT Performed at [facility]    Report Status PENDING  Incomplete  Culture, blood (routine x 2)     Status: None (Preliminary result)   Collection Time: 12/01/14 10:09 PM  Result Value Ref Range Status   Specimen Description BLOOD LEFT ARM  Final   Special Requests   Final    BOTTLES DRAWN AEROBIC ONLY 10CC PT ON ZOSYN,VANCOMYCIN   Culture   Final           BLOOD CULTURE RECEIVED NO GROWTH TO DATE CULTURE WILL BE HELD FOR 5 DAYS BEFORE ISSUING A FINAL NEGATIVE REPORT Note: Culture results may be compromised due to an excessive volume of blood received in culture bottles. Performed at [facility]    Report Status PENDING  Incomplete     Studies: Dg Abd 1 View  12/03/2014   CLINICAL DATA:  Patient with nausea and vomiting.  EXAM: ABDOMEN - 1 VIEW  COMPARISON:  None.  FINDINGS: Gas is demonstrated within non dilated loops of large and small bowel in a nonobstructed pattern. Supine evaluation limited for the detection of free intraperitoneal air. Large amount of stool within the transverse and descending colon. Unremarkable osseous skeleton.  IMPRESSION: Nonobstructed bowel gas pattern.    Electronically Signed   By: Lovey Newcomer M.D.   On: 12/03/2014 12:50   Mr Foot Right Wo Contrast  12/05/2014   CLINICAL DATA:  Puncture wound on the volar surface of the right midfoot 2 days ago with redness, swelling and tenderness extending to the medial malleolus.  EXAM: MRI OF THE RIGHT FOREFOOT WITHOUT CONTRAST  TECHNIQUE: Multiplanar, multisequence MR imaging was performed. No intravenous contrast was administered.  COMPARISON:  Plain films right foot 12/01/2014.  FINDINGS: Diffuse subcutaneous edema is seen about the foot and ankle. More focal edema is seen in subcutaneous fat on the plantar surface of the foot where there appears to be a puncture wound. This area of  edema measures 2.0 cm long by 2.7 cm transverse by 0.8 cm craniocaudal and may represent phlegmon or early abscess. No intramuscular abscess is identified. There is no bone marrow signal to suggest osteomyelitis. Intrinsic musculature the foot demonstrates mild edema throughout likely due to denervation atrophy.  IMPRESSION: Puncture wound on the plantar surface of the foot with underlying focal edema in subcutaneous fat which may represent phlegmon/early abscess. No deep abscess or evidence of osteomyelitis is identified.  Increased T2 signal throughout intrinsic musculature the foot is likely due to denervation atrophy.   Electronically Signed   By: Inge Rise M.D.   On: 12/05/2014 08:22   Dg Foot Complete Right  12/01/2014   CLINICAL DATA:  Right foot pain stepped on nail 3 days ago redness swelling around foot cellulitis puncture wound lateral aspect first toe  EXAM: RIGHT FOOT COMPLETE - 3+ VIEW  COMPARISON:  None.  FINDINGS: Soft tissue swelling first toe. No fracture or dislocation. No radiodense foreign body. No evidence of periosteal reaction or osteomyelitis.  IMPRESSION: Soft tissue swelling suggesting cellulitis.   Electronically Signed   By: Skipper Cliche M.D.   On: 12/01/2014 20:18    Scheduled Meds: . amLODipine  10  mg Oral Daily  . gabapentin  100 mg Oral BID  . heparin  5,000 Units Subcutaneous 3 times Juan day  . insulin aspart  0-15 Units Subcutaneous TID WC  . insulin aspart  0-5 Units Subcutaneous QHS  . insulin aspart protamine- aspart  18 Units Subcutaneous BID WC  . lisinopril  40 mg Oral Daily  . piperacillin-tazobactam (ZOSYN)  IV  3.375 g Intravenous 3 times Juan day  . vancomycin  750 mg Intravenous Q12H   Continuous Infusions:    Principal Problem:   Cellulitis of right foot Active Problems:   Neuropathic foot ulcer   Essential hypertension   Diabetic neuropathy   CKD (chronic kidney disease)    Time spent: 25 minutes   [facility] Pager [phone]. If 7PM-7AM, please contact night-coverage at [email or web address], password [facility] 12/05/2014, 10:53 AM  LOS: 4 days

## 2014-12-05 NOTE — Anesthesia Postprocedure Evaluation (Signed)
Anesthesia Post Note  Patient: Juan Werner  Procedure(s) Performed: Procedure(s) (LRB): IRRIGATION AND DEBRIDEMENT OF FOOT (Right)  Anesthesia type: general  Patient location: PACU  Post pain: Pain level controlled  Post assessment: Patient's Cardiovascular Status Stable  Last Vitals:  Filed Vitals:   12/05/14 2115  BP: 146/82  Pulse: 87  Temp: 37.1 C  Resp: 12    Post vital signs: Reviewed and stable  Level of consciousness: sedated  Complications: No apparent anesthesia complications

## 2014-12-05 NOTE — Transfer of Care (Signed)
Immediate Anesthesia Transfer of Care Note  Patient: Juan Werner  Procedure(s) Performed: Procedure(s): IRRIGATION AND DEBRIDEMENT OF FOOT (Right)  Patient Location: PACU  Anesthesia Type:General  Level of Consciousness: awake, alert , oriented and patient cooperative  Airway & Oxygen Therapy: Patient Spontanous Breathing and Patient connected to nasal cannula oxygen  Post-op Assessment: Report given to RN, Post -op Vital signs reviewed and stable and Patient moving all extremities X 4  Post vital signs: Reviewed and stable  Last Vitals:  Filed Vitals:   12/05/14 1327  BP: 140/68  Pulse: 82  Temp: 37.3 C  Resp: 18    Complications: Top front teeth out at extubation  Pt was forwarned about possible removal

## 2014-12-05 NOTE — Consult Note (Signed)
Reason for Consult:Non-resolving cellulitis after puncture wound to the foot. Referring Physician: Hospitalists  Juan Werner is an 44 y.o. male.  HPI: The patient is a 44 year old male who had a puncture wound to his right foot several days ago.  He's been on antibiotics with no resolution of symptoms.  MRI was obtained showing phlegmon versus abscess in the foot.  We are consult for evaluation and management.  The patient is a known diabetic.The patient speaks no [place] and all history and exam are done through an interpreter  telephone.  Past Medical History  Diagnosis Date  . Diabetes mellitus with neuropathy   . Hypertension     Past Surgical History  Procedure Laterality Date  . Toe amputation Left     great toe    History reviewed. No pertinent family history.  Social History:  reports that he has been smoking.  He does not have any smokeless tobacco history on file. He reports that he does not drink alcohol or use illicit drugs.  Allergies: No Known Allergies  Medications: I have reviewed the patient's current medications.  Results for orders placed or performed during the hospital encounter of 12/01/14 (from the past 48 hour(s))  Glucose, capillary     Status: Abnormal   Collection Time: 12/03/14  5:14 PM  Result Value Ref Range   Glucose-Capillary 131 (H) 70 - 99 mg/dL  Glucose, capillary     Status: Abnormal   Collection Time: 12/03/14  9:56 PM  Result Value Ref Range   Glucose-Capillary 68 (L) 70 - 99 mg/dL  Glucose, capillary     Status: None   Collection Time: 12/03/14 10:41 PM  Result Value Ref Range   Glucose-Capillary 89 70 - 99 mg/dL  Glucose, capillary     Status: Abnormal   Collection Time: 12/04/14  8:23 AM  Result Value Ref Range   Glucose-Capillary 133 (H) 70 - 99 mg/dL  Glucose, capillary     Status: Abnormal   Collection Time: 12/04/14 12:39 PM  Result Value Ref Range   Glucose-Capillary 107 (H) 70 - 99 mg/dL   Comment 1 Repeat Test    Glucose, capillary     Status: Abnormal   Collection Time: 12/04/14  4:20 PM  Result Value Ref Range   Glucose-Capillary 106 (H) 70 - 99 mg/dL   Comment 1 Notify RN   Glucose, capillary     Status: Abnormal   Collection Time: 12/04/14  9:31 PM  Result Value Ref Range   Glucose-Capillary 131 (H) 70 - 99 mg/dL   Comment 1 Notify RN   Basic metabolic panel     Status: Abnormal   Collection Time: 12/05/14  4:52 AM  Result Value Ref Range   Sodium 136 135 - 145 mmol/L   Potassium 4.2 3.5 - 5.1 mmol/L   Chloride 103 101 - 111 mmol/L   CO2 25 22 - 32 mmol/L   Glucose, Bld 96 70 - 99 mg/dL   BUN 17 6 - 20 mg/dL   Creatinine, Ser 1.79 (H) 0.61 - 1.24 mg/dL   Calcium 8.3 (L) 8.9 - 10.3 mg/dL   GFR calc non Af Amer 45 (L) >60 mL/min   GFR calc Af Amer 52 (L) >60 mL/min    Comment: (NOTE) The eGFR has been calculated using the CKD EPI equation. This calculation has not been validated in all clinical situations. eGFR's persistently <90 mL/min signify possible Chronic Kidney Disease.    Anion gap 8 5 - 15  CBC  Status: Abnormal   Collection Time: 12/05/14  4:52 AM  Result Value Ref Range   WBC 16.1 (H) 4.0 - 10.5 K/uL   RBC 3.37 (L) 4.22 - 5.81 MIL/uL   Hemoglobin 9.6 (L) 13.0 - 17.0 g/dL   HCT 28.7 (L) 39.0 - 52.0 %   MCV 85.2 78.0 - 100.0 fL   MCH 28.5 26.0 - 34.0 pg   MCHC 33.4 30.0 - 36.0 g/dL   RDW 12.2 11.5 - 15.5 %   Platelets 270 150 - 400 K/uL  Glucose, capillary     Status: Abnormal   Collection Time: 12/05/14  8:43 AM  Result Value Ref Range   Glucose-Capillary 160 (H) 70 - 99 mg/dL   Comment 1 Notify RN   Glucose, capillary     Status: None   Collection Time: 12/05/14 12:10 PM  Result Value Ref Range   Glucose-Capillary 84 70 - 99 mg/dL   Comment 1 Notify RN     Mr Foot Right Wo Contrast  12/05/2014   CLINICAL DATA:  Puncture wound on the volar surface of the right midfoot 2 days ago with redness, swelling and tenderness extending to the medial malleolus.   EXAM: MRI OF THE RIGHT FOREFOOT WITHOUT CONTRAST  TECHNIQUE: Multiplanar, multisequence MR imaging was performed. No intravenous contrast was administered.  COMPARISON:  Plain films right foot 12/01/2014.  FINDINGS: Diffuse subcutaneous edema is seen about the foot and ankle. More focal edema is seen in subcutaneous fat on the plantar surface of the foot where there appears to be a puncture wound. This area of edema measures 2.0 cm long by 2.7 cm transverse by 0.8 cm craniocaudal and may represent phlegmon or early abscess. No intramuscular abscess is identified. There is no bone marrow signal to suggest osteomyelitis. Intrinsic musculature the foot demonstrates mild edema throughout likely due to denervation atrophy.  IMPRESSION: Puncture wound on the plantar surface of the foot with underlying focal edema in subcutaneous fat which may represent phlegmon/early abscess. No deep abscess or evidence of osteomyelitis is identified.  Increased T2 signal throughout intrinsic musculature the foot is likely due to denervation atrophy.   Electronically Signed   By: Inge Rise M.D.   On: 12/05/2014 08:22    ROS  ROS: I have reviewed the patient's review of systems thoroughly and there are no positive responses as relates to the HPI.  Blood pressure 132/65, pulse 81, temperature 98.4 F (36.9 C), temperature source Oral, resp. rate 18, height $RemoveBe'5\' 6"'BmymbAEeb$  (1.676 m), weight 152 lb 8.9 oz (69.2 kg), SpO2 99 %. Physical Exam Well-developed well-nourished patient in no acute distress. Alert and oriented x3 HEENT:within normal limits Cardiac: Regular rate and rhythm Pulmonary: Lungs clear to auscultation Abdomen: Soft and nontender.  Normal active bowel sounds  Musculoskeletal: (Right foot: Significant cellulitis and moderate soft tissue swelling over the foot. Moderate cellulitis over the lateral aspect of the distal ankle.  Minimal tenderness to palpation in this area. Small puncture wound over the plantar aspect  which looks to be sealed over.  Moderate pain to range of motion. Assessment/Plan: 44 year old diabetic male status post puncture wound which has not resolved with antibiotic therapy over 4 days.  MRI suggestive of phlegmon versus abscess.  These injuries can be somewhat slow to resolve with antibiotic therapy but given the fact the patient is diabetic and has had no significant relief up to this point.  I think that surgical invention to do irrigation debridement of the foot is appropriate.  We'll  make sure that there is no extension of this area and to the small joints of the foot.I have had a prolonged discussion with the patient regarding the risk and benefits of the surgical procedure.  The patient understands the risks include but are not limited to bleeding, infection and failure of the surgery to cure the problem and need for further surgery.  The patient understands there is a slight risk of death at the time of surgery.  The patient understands these risks along with the potential benefits and wishes to proceed with surgical intervention. The patient will be followed in the office in the postoperative period.  Pegah Segel L 12/05/2014, 12:55 PM

## 2014-12-05 NOTE — Anesthesia Procedure Notes (Signed)
Procedure Name: Intubation Date/Time: 12/05/2014 8:15 PM Performed by: Claris Che Pre-anesthesia Checklist: Patient identified, Emergency Drugs available, Suction available, Patient being monitored and Timeout performed Patient Re-evaluated:Patient Re-evaluated prior to inductionOxygen Delivery Method: Circle system utilized Preoxygenation: Pre-oxygenation with 100% oxygen Intubation Type: IV induction, Rapid sequence and Cricoid Pressure applied Ventilation: Mask ventilation without difficulty Laryngoscope Size: Mac and 3 Grade View: Grade III Tube type: Oral Tube size: 7.5 mm Number of attempts: 2 Airway Equipment and Method: Stylet,  Bougie stylet and LTA kit utilized Placement Confirmation: ETT inserted through vocal cords under direct vision,  positive ETCO2 and breath sounds checked- equal and bilateral Secured at: 23 cm Tube secured with: Tape Dental Injury: Teeth and Oropharynx as per pre-operative assessment  Difficulty Due To: Difficult Airway- due to dentition and Difficulty was anticipated

## 2014-12-05 NOTE — Brief Op Note (Signed)
12/01/2014 - 12/05/2014  8:58 PM  PATIENT:  Juan Werner  44 y.o. male  PRE-OPERATIVE DIAGNOSIS:  CELLULITIS OF RIGHT FOOT  POST-OPERATIVE DIAGNOSIS:  CELLULITIS OF RIGHT FOOT  PROCEDURE:  Procedure(s): IRRIGATION AND DEBRIDEMENT OF FOOT (Right)  SURGEON:  Surgeon(s) and Role:    * Jodi GeraldsJohn Gianny Sabino, MD - Primary  PHYSICIAN ASSISTANT:   ASSISTANTS: bethune   ANESTHESIA:   general  EBL:     BLOOD ADMINISTERED:none  DRAINS: Penrose drain in the r foot   LOCAL MEDICATIONS USED:  NONE  SPECIMEN:  No Specimen  DISPOSITION OF SPECIMEN:  N/A  COUNTS:  YES  TOURNIQUET:  * No tourniquets in log *  DICTATION: .Other Dictation: Dictation Number [id number]  PLAN OF CARE: Admit to inpatient   PATIENT DISPOSITION:  PACU - hemodynamically stable.   Delay start of Pharmacological VTE agent (>24hrs) due to surgical blood loss or risk of bleeding: no

## 2014-12-05 NOTE — Progress Notes (Addendum)
Received patient from PACU.  Patient alert, VS stable, able to wiggle toes of right foot capillary refill good, no c/o pain but just numbness on right foot due to recent I & D which is now wrapped with compression wrap.  Spanish interpreter via phone provided interpretation.  Patient resting comfortably on bed and requested something to eat since patient was NPO since yesterday.  Food ordered and provided some crackers and juice to drink. Will continue to monitor.

## 2014-12-05 NOTE — Discharge Instructions (Signed)
Wear post op shoe when up

## 2014-12-05 NOTE — Anesthesia Preprocedure Evaluation (Addendum)
Anesthesia Evaluation  Patient identified by MRN, date of birth, ID band Patient awake    Reviewed: Allergy & Precautions, NPO status , Patient's Chart, lab work & pertinent test results  History of Anesthesia Complications Negative for: history of anesthetic complications  Airway Mallampati: II  TM Distance: >3 FB Neck ROM: Full    Dental  (+) Partial Upper, Poor Dentition, Dental Advisory Given, Loose,    Pulmonary Current Smoker,    Pulmonary exam normal       Cardiovascular hypertension, Normal cardiovascular exam    Neuro/Psych negative neurological ROS  negative psych ROS   GI/Hepatic negative GI ROS, Neg liver ROS,   Endo/Other  diabetes  Renal/GU Renal disease     Musculoskeletal   Abdominal   Peds  Hematology   Anesthesia Other Findings   Reproductive/Obstetrics                            Anesthesia Physical Anesthesia Plan  ASA: III and emergent  Anesthesia Plan: General   Post-op Pain Management:    Induction: Intravenous, Rapid sequence and Cricoid pressure planned  Airway Management Planned: Oral ETT  Additional Equipment:   Intra-op Plan:   Post-operative Plan: Extubation in OR  Informed Consent: I have reviewed the patients History and Physical, chart, labs and discussed the procedure including the risks, benefits and alternatives for the proposed anesthesia with the patient or authorized representative who has indicated his/her understanding and acceptance.   Dental advisory given  Plan Discussed with: CRNA, Anesthesiologist and Surgeon  Anesthesia Plan Comments: (Interpreter was used for the interview)        Anesthesia Quick Evaluation

## 2014-12-05 NOTE — Progress Notes (Signed)
ANTIBIOTIC CONSULT NOTE - FOLLOW UP  Pharmacy Consult for Vancomycin Indication: cellulitis  No Known Allergies  Patient Measurements: Height: 5\' 6"  (167.6 cm) Weight: 152 lb 8.9 oz (69.2 kg) IBW/kg (Calculated) : 63.8 Adjusted Body Weight:   Vital Signs: Temp: 99.2 F (37.3 C) (05/05 1327) Temp Source: Oral (05/05 1327) BP: 140/68 mmHg (05/05 1327) Pulse Rate: 82 (05/05 1327) Intake/Output from previous day: 05/04 0701 - 05/05 0700 In: 1740 [P.O.:1540; IV Piggyback:200] Out: 1050 [Urine:1050] Intake/Output from this shift: Total I/O In: -  Out: 625 [Urine:625]  Labs:  Recent Labs  12/03/14 0445 12/05/14 0452  WBC 12.7* 16.1*  HGB 10.5* 9.6*  PLT 211 270  CREATININE 1.69* 1.79*   Estimated Creatinine Clearance: 48 mL/min (by C-G formula based on Cr of 1.79). No results for input(s): VANCOTROUGH, VANCOPEAK, VANCORANDOM, GENTTROUGH, GENTPEAK, GENTRANDOM, TOBRATROUGH, TOBRAPEAK, TOBRARND, AMIKACINPEAK, AMIKACINTROU, AMIKACIN in the last 72 hours.   Microbiology: Recent Results (from the past 720 hour(s))  Culture, blood (routine x 2)     Status: None (Preliminary result)   Collection Time: 12/01/14 10:00 PM  Result Value Ref Range Status   Specimen Description BLOOD RIGHT HAND  Final   Special Requests   Final    BOTTLES DRAWN AEROBIC AND ANAEROBIC 8CC EA PT ON ZOSYN,VANCOMYCIN   Culture   Final           BLOOD CULTURE RECEIVED NO GROWTH TO DATE CULTURE WILL BE HELD FOR 5 DAYS BEFORE ISSUING A FINAL NEGATIVE REPORT Performed at [facility]    Report Status PENDING  Incomplete  Culture, blood (routine x 2)     Status: None (Preliminary result)   Collection Time: 12/01/14 10:09 PM  Result Value Ref Range Status   Specimen Description BLOOD LEFT ARM  Final   Special Requests   Final    BOTTLES DRAWN AEROBIC ONLY 10CC PT ON ZOSYN,VANCOMYCIN   Culture   Final           BLOOD CULTURE RECEIVED NO GROWTH TO DATE CULTURE WILL BE HELD FOR 5 DAYS BEFORE  ISSUING A FINAL NEGATIVE REPORT Note: Culture results may be compromised due to an excessive volume of blood received in culture bottles. Performed at [facility]    Report Status PENDING  Incomplete    Anti-infectives    Start     Dose/Rate Route Frequency Ordered Stop   12/05/14 2200  vancomycin (VANCOCIN) 1,250 mg in sodium chloride 0.9 % 250 mL IVPB     1,250 mg 166.7 mL/hr over 90 Minutes Intravenous Every 24 hours 12/05/14 1340     12/03/14 1800  vancomycin (VANCOCIN) IVPB 750 mg/150 ml premix  Status:  Discontinued     750 mg 150 mL/hr over 60 Minutes Intravenous Every 12 hours 12/03/14 1508 12/05/14 1340   12/02/14 0600  vancomycin (VANCOCIN) IVPB 1000 mg/200 mL premix  Status:  Discontinued     1,000 mg 200 mL/hr over 60 Minutes Intravenous Every 12 hours 12/02/14 0425 12/03/14 1508   12/02/14 0500  piperacillin-tazobactam (ZOSYN) IVPB 3.375 g     3.375 g 12.5 mL/hr over 240 Minutes Intravenous 3 times per day 12/02/14 0425     12/01/14 1930  vancomycin (VANCOCIN) IVPB 1000 mg/200 mL premix     1,000 mg 200 mL/hr over 60 Minutes Intravenous  Once 12/01/14 1924 12/01/14 2134   12/01/14 1930  piperacillin-tazobactam (ZOSYN) IVPB 3.375 g     3.375 g 100 mL/hr over 30 Minutes Intravenous  Once 12/01/14  1924 12/01/14 2034      Assessment: 44yo male with cellulitis of foot at site of puncture wound.  Pt with continued fevers, Tm 103 last PM, and WBC 16.1- increased.  His Cr continues to trend upward, 1.79 this AM, despite Vanc dosage change earlier this week.  MRI(+) possible early abscess, (-)deep abscess.  Blood cx are ntd.  Goal of Therapy:  Vancomycin trough level 10-15 mcg/ml  Plan:  Change Vancomycin to 1250mg  IV q24, next dose at [place] Continue Zosyn 3.375g IV q8, 4hr infusion F/U cx Watch renal fxn Check ss trough  Marisue HumbleKendra Terianne Thaker, PharmD Clinical Pharmacist [facility]

## 2014-12-06 LAB — GLUCOSE, CAPILLARY
GLUCOSE-CAPILLARY: 160 mg/dL — AB (ref 70–99)
GLUCOSE-CAPILLARY: 129 mg/dL — AB (ref 70–99)
GLUCOSE-CAPILLARY: 155 mg/dL — AB (ref 70–99)
GLUCOSE-CAPILLARY: 120 mg/dL — AB (ref 70–99)
Glucose-Capillary: 170 mg/dL — ABNORMAL HIGH (ref 70–99)

## 2014-12-06 MED ORDER — MORPHINE SULFATE 2 MG/ML IJ SOLN
2.0000 mg | Freq: Once | INTRAMUSCULAR | Status: AC
  Administered 2014-12-06: 2 mg via INTRAVENOUS
  Filled 2014-12-06: qty 1

## 2014-12-06 MED ORDER — MUPIROCIN 2 % EX OINT
1.0000 "application " | TOPICAL_OINTMENT | Freq: Two times a day (BID) | CUTANEOUS | Status: DC
  Administered 2014-12-06 – 2014-12-08 (×7): 1 via NASAL
  Filled 2014-12-06 (×4): qty 22

## 2014-12-06 MED ORDER — KETOROLAC TROMETHAMINE 30 MG/ML IJ SOLN
30.0000 mg | Freq: Once | INTRAMUSCULAR | Status: AC
  Administered 2014-12-06: 30 mg via INTRAVENOUS
  Filled 2014-12-06: qty 1

## 2014-12-06 MED ORDER — OXYCODONE-ACETAMINOPHEN 5-325 MG PO TABS
1.0000 | ORAL_TABLET | ORAL | Status: DC | PRN
  Administered 2014-12-06 – 2014-12-08 (×5): 2 via ORAL
  Filled 2014-12-06 (×5): qty 2

## 2014-12-06 MED ORDER — CHLORHEXIDINE GLUCONATE CLOTH 2 % EX PADS
6.0000 | MEDICATED_PAD | Freq: Every day | CUTANEOUS | Status: DC
  Administered 2014-12-06 – 2014-12-08 (×3): 6 via TOPICAL

## 2014-12-06 NOTE — Progress Notes (Signed)
Subjective: 1 Day Post-Op Procedure(s) (LRB): IRRIGATION AND DEBRIDEMENT OF FOOT (Right) Patient reports pain as mild. Patient sitting up in chair.   Objective: Vital signs in last 24 hours: Temp:  [98.4 F (36.9 C)-99.3 F (37.4 C)] 99.3 F (37.4 C) (05/06 0437) Pulse Rate:  [81-95] 95 (05/06 0437) Resp:  [7-18] 18 (05/06 0437) BP: (127-146)/(65-82) 134/71 mmHg (05/06 0437) SpO2:  [93 %-99 %] 93 % (05/06 0437)  Intake/Output from previous day: 05/05 0701 - 05/06 0700 In: 1822.5 [P.O.:480; I.V.:1042.5; IV Piggyback:300] Out: 1425 [Urine:1425] Intake/Output this shift: Total I/O In: 240 [P.O.:240] Out: -    Recent Labs  12/05/14 0452  HGB 9.6*    Recent Labs  12/05/14 0452  WBC 16.1*  RBC 3.37*  HCT 28.7*  PLT 270    Recent Labs  12/05/14 0452  NA 136  K 4.2  CL 103  CO2 25  BUN 17  CREATININE 1.79*  GLUCOSE 96  CALCIUM 8.3*   cultures pending. No results for input(s): LABPT, INR in the last 72 hours. Right foot exam: Dressing clean and dry. No redness or streaking up the leg. Postop shoe intact.   Assessment/Plan: 1 Day Post-Op Procedure(s) (LRB): IRRIGATION AND DEBRIDEMENT OF FOOT (Right) Plan: Continue IV antibiotics. Will need dressing change tomorrow and have the Penrose drain pulled. May weight-bear as tolerated on right foot with postop shoe in place. We will follow.   Crystol Walpole G 12/06/2014, 10:40 AM

## 2014-12-06 NOTE — Op Note (Signed)
NAME:  Juan Werner, Elba            ACCOUNT NO.:  [id number]  MEDICAL RECORD NO.:  [id number]  LOCATION:  [id number]                        FACILITY:  [facility]  PHYSICIAN:  Harvie JuniorJohn L. Blaiden Werth, M.D.   DATE OF BIRTH:  1970-10-28  DATE OF PROCEDURE:  12/05/2014 DATE OF DISCHARGE:                              OPERATIVE REPORT   INTERNAL MEDICINE SERVICE  PREOPERATIVE DIAGNOSIS:  Infection right foot, status post puncture wound 4 days ago.  POSTOPERATIVE DIAGNOSIS:  Infection right foot, status post puncture wound 4 days ago.  PROCEDURE:  Irrigation and debridement of right abscess of the foot with placement of Penrose drain.  SURGEON:  Harvie JuniorJohn L. Rylei Masella, M.D.  ASSISTANT:  Marshia LyJames Bethune, P.A.  ANESTHESIA:  General.  BRIEF HISTORY:  [name] is a 44 year old male, diabetic, who stepped on something 4 days ago.  He was admitted to Medicine Service and followed.  He had cellulitis which was not resolving.  He had an MRI which showed an abscess in the foot and because of lack of response to IV antibiotics and a known abscess, he was taken to the operating room for irrigation and debridement.  PROCEDURE IN DETAIL:  The patient was taken to the operating room. After adequate anesthesia was obtained with general anesthetic, the patient was placed supine on the operating table.  The right leg was then prepped and draped in usual sterile fashion.  Following this, an incision was made over the area of the puncture wound, subcutaneous tissue down to the level of the identifiable abscess.  There was significant amount of pus in this area which was cultured and sent for gram stain and culture.  At this point, we then irrigated this area thoroughly and made sure that we had debrided all the devitalized tissue in this area, tracked it right up to where the erythema was laterally and made sure that we washed this area as well.  There was no frank pus coming from this area.  Once this was done, the wound  was copiously irrigated, suctioned dry, Penrose drain placed and a couple of stitches placed loosely and a sterile compressive dressing was applied.  The patient was taken to recovery room in satisfactory condition.  Estimated blood loss for the procedure was minimal.    Harvie JuniorJohn L. Sumi Lye, M.D.    Ranae PlumberJLG/MEDQ  D:  12/05/2014  T:  12/06/2014  Job:  [id number]

## 2014-12-06 NOTE — Clinical Documentation Improvement (Signed)
Please clarify documentation in the medical record of "debridement" found in Op Note 12/05/14 Please document the below (4) key elements in a progress note:  ? Type of instrument used? ? Depth of debridement/type of tissue removed? (e.g., skin, subcutaneous tissue, fascia, muscle, bone, other) ? Excisional vs. nonexcisional? ? Irrigation of wound (e.g, Versajet)? ? Document the size of the debridement.  Key wording: Excisional vs Non-Excisional Debridement and Depth of Debridement  Thank You,  Shellee MiloEileen T Genoa Freyre ,RN Clinical Documentation Specialist:  [phone]  [facility]- [facility] Information Management

## 2014-12-06 NOTE — Clinical Documentation Improvement (Signed)
A cause and effect relationship may not be assumed and must be documented by a provider. Please clarify the relationship, if any, between the patient's cellulitis and his:   DM2 with Neuropathy.  DM2 with Hyperglycemia  DM2 Other Manifestation  Unrelated to each other  Unable to be Determined  Please document your findings in next progress note and include in discharge summary if applicable.  Thank you, Shellee MiloEileen T Kennley Schwandt ,RN Clinical Documentation Specialist:  [phone]  [facility]

## 2014-12-06 NOTE — Progress Notes (Signed)
Patient had a temp of 101. Gave 2 Apap.

## 2014-12-06 NOTE — Evaluation (Signed)
Physical Therapy Re-Evaluation Patient Details Name: Juan Werner MRN: [id number] DOB: 01/19/1971 Today's Date: 12/06/2014   History of Present Illness  Cellulits of Right foot due to stepping on nail while at work. s/p Irrigation and debridement of right abscess of the foot with 12/05/14.  Clinical Impression  New order acknowledged for crutch training following the above procedure. Patient demonstrates ability to keep axillary pad of crutch in costal space, preventing compression of axillary vein and underlying PICC line. However, due to humeral adduction for support with the crutch, I am concerned for endothelial damage from PICC line with excessive pressure on the basilic vein. Pt does use this assistive device appropriately, however he reports that he feels much safer with the rolling walker. Shows ability to safely maintain weight on Rt heel during ambulation. All questions were answered and no further acute PT needs identified. Adequate for d/c from a mobility standpoint when medically ready.    Follow Up Recommendations No PT follow up    Equipment Recommendations  Rolling walker with 5" wheels    Recommendations for Other Services       Precautions / Restrictions Precautions Precautions: None Restrictions Weight Bearing Restrictions: Yes RLE Weight Bearing: Weight bearing as tolerated Other Position/Activity Restrictions: WBAT through RT heel with post-op shoe      Mobility  Bed Mobility Overal bed mobility: Modified Independent                Transfers Overall transfer level: Modified independent Equipment used: Crutches                Ambulation/Gait Ambulation/Gait assistance: Min guard Ambulation Distance (Feet): 60 Feet Assistive device: Crutches Gait Pattern/deviations: Step-through pattern (4 point gait) Gait velocity: slow Gait velocity interpretation: Below normal speed for age/gender General Gait Details: 4 point gait pattern with crutches.  VC for weight bearing status through Rt heel only. Intermittently checking for axillary pad placement to prevent from compressing axillary vein due to PICC line. Does well maintaining in costal region.  No loss of balance. Very guarded.  Stairs            Wheelchair Mobility    Modified Rankin (Stroke Patients Only)       Balance Overall balance assessment: Needs assistance Sitting-balance support: No upper extremity supported Sitting balance-Leahy Scale: Normal     Standing balance support: Single extremity supported Standing balance-Leahy Scale: Poor                               Pertinent Vitals/Pain Pain Assessment: No/denies pain    Home Living Family/patient expects to be discharged to:: Private residence Living Arrangements:  (with others) Available Help at Discharge: Available PRN/intermittently (others) Type of Home: House Home Access: Level entry     Home Layout: Two level;Able to live on main level with bedroom/bathroom Home Equipment: None      Prior Function Level of Independence: Independent               Hand Dominance   Dominant Hand: Right    Extremity/Trunk Assessment   Upper Extremity Assessment: Defer to OT evaluation           Lower Extremity Assessment: RLE deficits/detail         Communication   Communication: Prefers language other than English Financial trader(Spainish - interpreter today [id number])  Cognition Arousal/Alertness: Awake/alert Behavior During Therapy: WFL for tasks assessed/performed Overall Cognitive Status: Within Functional Limits for tasks  assessed                      General Comments General comments (skin integrity, edema, etc.): Due to humeral adduction and PICC line, feel RW would be safer than crutches. Pt states he feels much more confident with RW.    Exercises        Assessment/Plan    PT Assessment Patent does not need any further PT services  PT Diagnosis Difficulty walking    PT Problem List    PT Treatment Interventions     PT Goals (Current goals can be found in the Care Plan section) Acute Rehab PT Goals Patient Stated Goal: home soon PT Goal Formulation: All assessment and education complete, DC therapy    Frequency     Barriers to discharge        Co-evaluation               End of Session Equipment Utilized During Treatment: Gait belt Activity Tolerance: Patient tolerated treatment well Patient left: in bed;with call bell/phone within reach Nurse Communication: Mobility status         Time: [id number] PT Time Calculation (min) (ACUTE ONLY): 29 min   Charges:   PT Evaluation $PT Re-evaluation: 1 Procedure PT Treatments $Gait Training: 8-22 mins   PT G CodesBerton Mount:        Brennden Masten S 12/06/2014, 10:29 AM Charlsie MerlesLogan Secor Tylan Briguglio, PT [phone]

## 2014-12-06 NOTE — Progress Notes (Signed)
[facility] PROGRESS NOTE  Juan Werner JZP:[id number] DOB: September 07, 1970 DOA: 12/01/2014 PCP: No primary care provider on file.  Assessment/Plan:  Cellulitis of the right foot with abscess -following puncture wound -on day 5 of IV Vanc/Zosyn, ESR at 125, persistent fevers -MRI done due to persistent fevers and extension of area of erythema, shows possible subcut early abscess, no deep abscess or osteo, -s/p I&D and penrose drain 5/6 early am, appreciate Dr.Graves help -FU Wound Cx,  blood culture NGTD -Mobilize, Ambulate if ok with oRtho  Hypertension Continue Norvasc, hold ACE  Diabetes mellitus -hbA1c 8.6 -continue insulin 70/30 to 18 units twice a day , sliding scale insulin  Peripheral neuropathy Continue gabapentin  Chronic kidney disease stage III Creatinine unchanged, likely at baseline, baseline is not available in our system -hold ACE  Nausea Likely due to constipation, narcotics  resolved  Code Status: full Family Communication: none at bedside Disposition Plan:  Home when improved    Brief narrative: Juan Werner is a 44 y.o. male with Past medical history of diabetes mellitus hypertension and neuropathy. The patient is presenting with complaints of foot pain on the right. He mentions that 2 days ago while at work he placed his leg on a nail and had injury. After that he had progressively worsening foot pain. He has swelling of his foot with redness and worsening swelling. Therefore he came to the hospital.  Consultants:  None  Procedures:  None  Antibiotics: Vancomycin/Zosyn   HPI/Subjective: S/p I&D last pm, fevers down  Objective: Filed Vitals:   12/05/14 1327 12/05/14 2100 12/05/14 2115 12/06/14 0437  BP: 140/68 127/71 146/82 134/71  Pulse: 82 88 87 95  Temp: 99.2 F (37.3 C) 98.4 F (36.9 C) 98.7 F (37.1 C) 99.3 F (37.4 C)  TempSrc: Oral   Oral  Resp: _0 Height:      Weight:      SpO2: 99% 95% 94% 93%     Intake/Output Summary (Last 24 hours) at 12/06/14 1216 Last data filed at 12/06/14 0920  Gross per 24 hour  Intake 2062.5 ml  Output   1050 ml  Net 1012.5 ml    Exam:  General: No acute respiratory distress, AAOx3 Lungs: Clear to auscultation bilaterally without wheezes or crackles Cardiovascular: Regular rate and rhythm without murmur gallop or rub normal S1 and S2 Abdomen: Nontender, nondistended, soft, bowel sounds positive, no rebound, no ascites, no appreciable mass Extremities: No significant cyanosis, clubbing, or edema bilateral lower extremities Skin: erythema and swelling of R foot beyond the area that was demarcated,  puncture wound on dorsum of the right foot    Data Reviewed: Basic Metabolic Panel:  Recent Labs Lab 12/01/14 1930 12/02/14 0453 12/03/14 0445 12/05/14 0452  NA 136 135 136 136  K 4.3 4.5 4.4 4.2  CL 104 104 104 103  CO2 _1 GLUCOSE 208* 231* 162* 96  BUN _2 CREATININE 1.57* 1.52* 1.69* 1.79*  CALCIUM 8.8* 8.3* 8.5* 8.3*    Liver Function Tests:  Recent Labs Lab 12/02/14 0453 12/03/14 0445  AST 13* 12*  ALT 12* 10*  ALKPHOS 58 53  BILITOT 0.5 0.5  PROT 5.9* 5.6*  ALBUMIN 2.2* 2.0*   No results for input(s): LIPASE, AMYLASE in the last 168 hours. No results for input(s): AMMONIA in the last 168 hours.  CBC:  Recent Labs Lab 12/01/14 1930 12/02/14 0453 12/03/14 0445 12/05/14 0452  WBC 17.1* 16.0* 12.7* 16.1*  NEUTROABS 11.9*  --   --   --   HGB 12.3* 11.7* 10.5* 9.6*  HCT 36.4* 35.2* 30.9* 28.7*  MCV 85.8 85.9 86.8 85.2  PLT 229 220 211 270    Cardiac Enzymes:  Recent Labs Lab 12/01/14 2210  CKTOTAL 105   BNP (last 3 results) No results for input(s): BNP in the last 8760 hours.  ProBNP (last 3 results) No results for input(s): PROBNP in the last 8760 hours.    CBG:  Recent Labs Lab 12/05/14 1555 12/05/14 2110 12/05/14 2137 12/06/14 0752 12/06/14 1141  GLUCAP 77 85 94 160* 129*     Recent Results (from the past 240 hour(s))  Culture, blood (routine x 2)     Status: None (Preliminary result)   Collection Time: 12/01/14 10:00 PM  Result Value Ref Range Status   Specimen Description BLOOD RIGHT HAND  Final   Special Requests   Final    BOTTLES DRAWN AEROBIC AND ANAEROBIC 8CC EA PT ON ZOSYN,VANCOMYCIN   Culture   Final           BLOOD CULTURE RECEIVED NO GROWTH TO DATE CULTURE WILL BE HELD FOR 5 DAYS BEFORE ISSUING A FINAL NEGATIVE REPORT Performed at [facility]    Report Status PENDING  Incomplete  Culture, blood (routine x 2)     Status: None (Preliminary result)   Collection Time: 12/01/14 10:09 PM  Result Value Ref Range Status   Specimen Description BLOOD LEFT ARM  Final   Special Requests   Final    BOTTLES DRAWN AEROBIC ONLY 10CC PT ON ZOSYN,VANCOMYCIN   Culture   Final           BLOOD CULTURE RECEIVED NO GROWTH TO DATE CULTURE WILL BE HELD FOR 5 DAYS BEFORE ISSUING A FINAL NEGATIVE REPORT Note: Culture results may be compromised due to an excessive volume of blood received in culture bottles. Performed at [facility]    Report Status PENDING  Incomplete  Surgical pcr screen     Status: Abnormal   Collection Time: 12/05/14  4:28 PM  Result Value Ref Range Status   MRSA, PCR NEGATIVE NEGATIVE Final   Staphylococcus aureus POSITIVE (A) NEGATIVE Final    Comment:        The Xpert SA Assay (FDA approved for NASAL specimens in patients over 50 years of age), is one component of a comprehensive surveillance program.  Test performance has been validated by [facility] for patients greater than or equal to 32 year old. It is not intended to diagnose infection nor to guide or monitor treatment.   Anaerobic culture     Status: None (Preliminary result)   Collection Time: 12/05/14  8:54 PM  Result Value Ref Range Status   Specimen Description ABSCESS RIGHT FOOT  Final   Special Requests PATIENT ON FOLLOWING ZOSYN VANCOMYCIN  Final    Gram Stain PENDING  Incomplete   Culture   Final    NO ANAEROBES ISOLATED; CULTURE IN PROGRESS FOR 5 DAYS Performed at [facility]    Report Status PENDING  Incomplete     Studies: Dg Abd 1 View  12/03/2014   CLINICAL DATA:  Patient with nausea and vomiting.  EXAM: ABDOMEN - 1 VIEW  COMPARISON:  None.  FINDINGS: Gas is demonstrated within non dilated loops of large and small bowel in a nonobstructed pattern. Supine evaluation limited for the detection of free intraperitoneal air. Large amount of stool within the transverse and descending  colon. Unremarkable osseous skeleton.  IMPRESSION: Nonobstructed bowel gas pattern.   Electronically Signed   By: Lovey Newcomer M.D.   On: 12/03/2014 12:50   Mr Foot Right Wo Contrast  12/05/2014   CLINICAL DATA:  Puncture wound on the volar surface of the right midfoot 2 days ago with redness, swelling and tenderness extending to the medial malleolus.  EXAM: MRI OF THE RIGHT FOREFOOT WITHOUT CONTRAST  TECHNIQUE: Multiplanar, multisequence MR imaging was performed. No intravenous contrast was administered.  COMPARISON:  Plain films right foot 12/01/2014.  FINDINGS: Diffuse subcutaneous edema is seen about the foot and ankle. More focal edema is seen in subcutaneous fat on the plantar surface of the foot where there appears to be a puncture wound. This area of edema measures 2.0 cm long by 2.7 cm transverse by 0.8 cm craniocaudal and may represent phlegmon or early abscess. No intramuscular abscess is identified. There is no bone marrow signal to suggest osteomyelitis. Intrinsic musculature the foot demonstrates mild edema throughout likely due to denervation atrophy.  IMPRESSION: Puncture wound on the plantar surface of the foot with underlying focal edema in subcutaneous fat which may represent phlegmon/early abscess. No deep abscess or evidence of osteomyelitis is identified.  Increased T2 signal throughout intrinsic musculature the foot is likely due to  denervation atrophy.   Electronically Signed   By: Inge Rise M.D.   On: 12/05/2014 08:22   Dg Foot Complete Right  12/01/2014   CLINICAL DATA:  Right foot pain stepped on nail 3 days ago redness swelling around foot cellulitis puncture wound lateral aspect first toe  EXAM: RIGHT FOOT COMPLETE - 3+ VIEW  COMPARISON:  None.  FINDINGS: Soft tissue swelling first toe. No fracture or dislocation. No radiodense foreign body. No evidence of periosteal reaction or osteomyelitis.  IMPRESSION: Soft tissue swelling suggesting cellulitis.   Electronically Signed   By: Skipper Cliche M.D.   On: 12/01/2014 20:18    Scheduled Meds: . amLODipine  10 mg Oral Daily  . Chlorhexidine Gluconate Cloth  6 each Topical Daily  . gabapentin  100 mg Oral BID  . heparin  5,000 Units Subcutaneous 3 times per day  . insulin aspart  0-15 Units Subcutaneous TID WC  . insulin aspart  0-5 Units Subcutaneous QHS  . insulin aspart protamine- aspart  18 Units Subcutaneous BID WC  . mupirocin ointment  1 application Nasal BID  . piperacillin-tazobactam (ZOSYN)  IV  3.375 g Intravenous 3 times per day  . vancomycin  1,250 mg Intravenous Q24H   Continuous Infusions:    Principal Problem:   Cellulitis of right foot Active Problems:   Neuropathic foot ulcer   Essential hypertension   Diabetic neuropathy   CKD (chronic kidney disease)    Time spent: 25 minutes   [place] [facility] Pager [phone]. If 7PM-7AM, please contact night-coverage at [email or web address], password [facility] 12/06/2014, 12:16 PM  LOS: 5 days

## 2014-12-07 DIAGNOSIS — L02619 Cutaneous abscess of unspecified foot: Secondary | ICD-10-CM | POA: Diagnosis present

## 2014-12-07 LAB — CBC
HEMATOCRIT: 24.6 % — AB (ref 39.0–52.0)
Hemoglobin: 8.2 g/dL — ABNORMAL LOW (ref 13.0–17.0)
MCH: 28.7 pg (ref 26.0–34.0)
MCHC: 33.3 g/dL (ref 30.0–36.0)
MCV: 86 fL (ref 78.0–100.0)
Platelets: 273 10*3/uL (ref 150–400)
RBC: 2.86 MIL/uL — ABNORMAL LOW (ref 4.22–5.81)
RDW: 12.5 % (ref 11.5–15.5)
WBC: 15.4 10*3/uL — AB (ref 4.0–10.5)

## 2014-12-07 LAB — GLUCOSE, CAPILLARY
GLUCOSE-CAPILLARY: 119 mg/dL — AB (ref 70–99)
GLUCOSE-CAPILLARY: 206 mg/dL — AB (ref 70–99)
Glucose-Capillary: 92 mg/dL (ref 70–99)
Glucose-Capillary: 75 mg/dL (ref 70–99)

## 2014-12-07 LAB — BASIC METABOLIC PANEL
ANION GAP: 11 (ref 5–15)
BUN: 25 mg/dL — ABNORMAL HIGH (ref 6–20)
CO2: 23 mmol/L (ref 22–32)
Calcium: 7.8 mg/dL — ABNORMAL LOW (ref 8.9–10.3)
Chloride: 99 mmol/L — ABNORMAL LOW (ref 101–111)
Creatinine, Ser: 2.48 mg/dL — ABNORMAL HIGH (ref 0.61–1.24)
GFR calc non Af Amer: 30 mL/min — ABNORMAL LOW (ref >60)
GFR, EST AFRICAN AMERICAN: 35 mL/min — AB (ref >60)
Glucose, Bld: 90 mg/dL (ref 70–99)
POTASSIUM: 4 mmol/L (ref 3.5–5.1)
Sodium: 133 mmol/L — ABNORMAL LOW (ref 135–145)

## 2014-12-07 MED ORDER — SENNOSIDES-DOCUSATE SODIUM 8.6-50 MG PO TABS
1.0000 | ORAL_TABLET | Freq: Two times a day (BID) | ORAL | Status: DC
  Administered 2014-12-07 – 2014-12-10 (×7): 1 via ORAL
  Filled 2014-12-07 (×8): qty 1

## 2014-12-07 MED ORDER — POLYETHYLENE GLYCOL 3350 17 G PO PACK
17.0000 g | PACK | Freq: Every day | ORAL | Status: DC
  Administered 2014-12-07 – 2014-12-09 (×3): 17 g via ORAL
  Filled 2014-12-07 (×3): qty 1

## 2014-12-07 NOTE — Progress Notes (Signed)
Patient ID: Juan Werner, male   DOB: 08/27/1970, 44 y.o.   MRN: [id number] Subjective: One day status post irrigation and debridement of abscess right foot reporting diminished pain although he speaks very limited English.  Objective: Gram stain came back gram-positive cocci in pairs culture and sensitivities pending. I did take down his dressing today and removed the Penrose drain there is minimal serous drainage and the swelling to his foot according to the patient had diminished. He was able to move his ankle and toes without difficulty.  Assessment: 24-hour status post irrigation and debridement of abscess right foot growing gram-positive cocci  Plan: Continue IV antibiotics pending culture and sensitivities. The patient may ambulate nonweightbearing on the right foot using crutches.

## 2014-12-07 NOTE — Progress Notes (Addendum)
[facility] PROGRESS NOTE  Juan Werner [id number] DOB: Dec 23, 1970 DOA: 12/01/2014 PCP: No primary care provider on file.  Assessment/Plan:  Cellulitis of the right foot with abscess -following puncture wound -Now on day 6 of IV Vanc/Zosyn, ESR at 125, persistent fevers -MRI done due to persistent fevers and extension of area of erythema, shows possible subcut early abscess, no deep abscess or osteo, -s/p I&D and penrose drain 5/6 early am, appreciate Dr.Graves help -Ortho following, wound care -Wound Cx with staph aureus,  blood culture NGTD -Mobilize, Ambulate with walker  Hypertension Continue Norvasc, hold ACE  Diabetes mellitus -hbA1c 8.6 -stable -continue insulin 70/30 to 18 units twice a day , sliding scale insulin  Peripheral neuropathy Continue gabapentin  Chronic kidney disease stage III Creatinine unchanged, likely at baseline, baseline is not available in our system -hold ACE  Nausea/constipation -add miralax and senokot   Code Status: full Family Communication: none at bedside Disposition Plan:  Home when improved    Brief narrative: Juan Werner is a 44 y.o. male with Past medical history of diabetes mellitus hypertension and neuropathy. The patient is presenting with complaints of foot pain on the right. He mentions that 2 days ago while at work he placed his leg on a nail and had injury. After that he had progressively worsening foot pain. He has swelling of his foot with redness and worsening swelling. Therefore he came to the hospital.  Consultants:  None  Procedures:  None  Antibiotics: Vancomycin/Zosyn   HPI/Subjective: Temp yetserday, constipated Objective: Filed Vitals:   12/06/14 0437 12/06/14 1332 12/06/14 2135 12/07/14 0546  BP: 134/71 140/72 147/67 142/78  Pulse: 95 84 103 89  Temp: 99.3 F (37.4 C) 98.4 F (36.9 C) 101 F (38.3 C) 98.9 F (37.2 C)  TempSrc: Oral Oral Oral Oral  Resp: $Remo'18 18 18 18   'vBLwO$ Height:      Weight:      SpO2: 93% 94% 94% 92%    Intake/Output Summary (Last 24 hours) at 12/07/14 1147 Last data filed at 12/07/14 0849  Gross per 24 hour  Intake    720 ml  Output      0 ml  Net    720 ml    Exam:  General: No acute respiratory distress, AAOx3 Lungs: Clear to auscultation bilaterally without wheezes or crackles Cardiovascular: Regular rate and rhythm without murmur gallop or rub normal S1 and S2 Abdomen: Nontender, nondistended, soft, bowel sounds positive, no rebound, no ascites, no appreciable mass Extremities: No significant cyanosis, clubbing, or edema bilateral lower extremities Skin: erythema and swelling of R foot beyond the area that was demarcated,  puncture wound on dorsum of the right foot    Data Reviewed: Basic Metabolic Panel:  Recent Labs Lab 12/01/14 1930 12/02/14 0453 12/03/14 0445 12/05/14 0452 12/07/14 0416  NA 136 135 136 136 133*  K 4.3 4.5 4.4 4.2 4.0  CL 104 104 104 103 99*  CO2 $Re'24 23 24 25 23  'CBD$ GLUCOSE 208* 231* 162* 96 90  BUN $Re'17 16 19 17 'cVt$ 25*  CREATININE 1.57* 1.52* 1.69* 1.79* 2.48*  CALCIUM 8.8* 8.3* 8.5* 8.3* 7.8*    Liver Function Tests:  Recent Labs Lab 12/02/14 0453 12/03/14 0445  AST 13* 12*  ALT 12* 10*  ALKPHOS 58 53  BILITOT 0.5 0.5  PROT 5.9* 5.6*  ALBUMIN 2.2* 2.0*   No results for input(s): LIPASE, AMYLASE in the last 168 hours. No results for input(s): AMMONIA in the last 168 hours.  CBC:  Recent Labs Lab 12/01/14 1930 12/02/14 0453 12/03/14 0445 12/05/14 0452 12/07/14 0416  WBC 17.1* 16.0* 12.7* 16.1* 15.4*  NEUTROABS 11.9*  --   --   --   --   HGB 12.3* 11.7* 10.5* 9.6* 8.2*  HCT 36.4* 35.2* 30.9* 28.7* 24.6*  MCV 85.8 85.9 86.8 85.2 86.0  PLT 229 220 211 270 273    Cardiac Enzymes:  Recent Labs Lab 12/01/14 2210  CKTOTAL 105   BNP (last 3 results) No results for input(s): BNP in the last 8760 hours.  ProBNP (last 3 results) No results for input(s): PROBNP in the last  8760 hours.    CBG:  Recent Labs Lab 12/06/14 1141 12/06/14 1735 12/06/14 1834 12/06/14 2129 12/07/14 0718  GLUCAP 129* 170* 155* 120* 119*    Recent Results (from the past 240 hour(s))  Culture, blood (routine x 2)     Status: None (Preliminary result)   Collection Time: 12/01/14 10:00 PM  Result Value Ref Range Status   Specimen Description BLOOD RIGHT HAND  Final   Special Requests   Final    BOTTLES DRAWN AEROBIC AND ANAEROBIC 8CC EA PT ON ZOSYN,VANCOMYCIN   Culture   Final           BLOOD CULTURE RECEIVED NO GROWTH TO DATE CULTURE WILL BE HELD FOR 5 DAYS BEFORE ISSUING A FINAL NEGATIVE REPORT Performed at [facility]    Report Status PENDING  Incomplete  Culture, blood (routine x 2)     Status: None (Preliminary result)   Collection Time: 12/01/14 10:09 PM  Result Value Ref Range Status   Specimen Description BLOOD LEFT ARM  Final   Special Requests   Final    BOTTLES DRAWN AEROBIC ONLY 10CC PT ON ZOSYN,VANCOMYCIN   Culture   Final           BLOOD CULTURE RECEIVED NO GROWTH TO DATE CULTURE WILL BE HELD FOR 5 DAYS BEFORE ISSUING A FINAL NEGATIVE REPORT Note: Culture results may be compromised due to an excessive volume of blood received in culture bottles. Performed at [facility]    Report Status PENDING  Incomplete  Surgical pcr screen     Status: Abnormal   Collection Time: 12/05/14  4:28 PM  Result Value Ref Range Status   MRSA, PCR NEGATIVE NEGATIVE Final   Staphylococcus aureus POSITIVE (A) NEGATIVE Final    Comment:        The Xpert SA Assay (FDA approved for NASAL specimens in patients over 75 years of age), is one component of a comprehensive surveillance program.  Test performance has been validated by [facility] for patients greater than or equal to 9 year old. It is not intended to diagnose infection nor to guide or monitor treatment.   Culture, routine-abscess     Status: None (Preliminary result)   Collection Time:  12/05/14  8:54 PM  Result Value Ref Range Status   Specimen Description ABSCESS RIGHT FOOT  Final   Special Requests PATIENT ON FOLLOWING ZOSYN AND VANCO  Final   Gram Stain   Final    RARE WBC PRESENT,BOTH PMN AND MONONUCLEAR NO SQUAMOUS EPITHELIAL CELLS SEEN FEW GRAM POSITIVE COCCI IN PAIRS IN CLUSTERS Performed at [facility]    Culture   Final    MODERATE STAPHYLOCOCCUS AUREUS Note: RIFAMPIN AND GENTAMICIN SHOULD NOT BE USED AS SINGLE DRUGS FOR TREATMENT OF STAPH INFECTIONS. Performed at [facility]    Report Status PENDING  Incomplete  Anaerobic culture     Status: None (Preliminary result)   Collection Time: 12/05/14  8:54 PM  Result Value Ref Range Status   Specimen Description ABSCESS RIGHT FOOT  Final   Special Requests PATIENT ON FOLLOWING ZOSYN VANCOMYCIN  Final   Gram Stain   Final    RARE WBC PRESENT,BOTH PMN AND MONONUCLEAR NO SQUAMOUS EPITHELIAL CELLS SEEN FEW GRAM POSITIVE COCCI IN PAIRS IN CLUSTERS Performed at [facility]    Culture   Final    NO ANAEROBES ISOLATED; CULTURE IN PROGRESS FOR 5 DAYS Performed at [facility]    Report Status PENDING  Incomplete     Studies: Dg Abd 1 View  12/03/2014   CLINICAL DATA:  Patient with nausea and vomiting.  EXAM: ABDOMEN - 1 VIEW  COMPARISON:  None.  FINDINGS: Gas is demonstrated within non dilated loops of large and small bowel in a nonobstructed pattern. Supine evaluation limited for the detection of free intraperitoneal air. Large amount of stool within the transverse and descending colon. Unremarkable osseous skeleton.  IMPRESSION: Nonobstructed bowel gas pattern.   Electronically Signed   By: Lovey Newcomer M.D.   On: 12/03/2014 12:50   Mr Foot Right Wo Contrast  12/05/2014   CLINICAL DATA:  Puncture wound on the volar surface of the right midfoot 2 days ago with redness, swelling and tenderness extending to the medial malleolus.  EXAM: MRI OF THE RIGHT FOREFOOT WITHOUT CONTRAST   TECHNIQUE: Multiplanar, multisequence MR imaging was performed. No intravenous contrast was administered.  COMPARISON:  Plain films right foot 12/01/2014.  FINDINGS: Diffuse subcutaneous edema is seen about the foot and ankle. More focal edema is seen in subcutaneous fat on the plantar surface of the foot where there appears to be a puncture wound. This area of edema measures 2.0 cm long by 2.7 cm transverse by 0.8 cm craniocaudal and may represent phlegmon or early abscess. No intramuscular abscess is identified. There is no bone marrow signal to suggest osteomyelitis. Intrinsic musculature the foot demonstrates mild edema throughout likely due to denervation atrophy.  IMPRESSION: Puncture wound on the plantar surface of the foot with underlying focal edema in subcutaneous fat which may represent phlegmon/early abscess. No deep abscess or evidence of osteomyelitis is identified.  Increased T2 signal throughout intrinsic musculature the foot is likely due to denervation atrophy.   Electronically Signed   By: Inge Rise M.D.   On: 12/05/2014 08:22   Dg Foot Complete Right  12/01/2014   CLINICAL DATA:  Right foot pain stepped on nail 3 days ago redness swelling around foot cellulitis puncture wound lateral aspect first toe  EXAM: RIGHT FOOT COMPLETE - 3+ VIEW  COMPARISON:  None.  FINDINGS: Soft tissue swelling first toe. No fracture or dislocation. No radiodense foreign body. No evidence of periosteal reaction or osteomyelitis.  IMPRESSION: Soft tissue swelling suggesting cellulitis.   Electronically Signed   By: Skipper Cliche M.D.   On: 12/01/2014 20:18    Scheduled Meds: . amLODipine  10 mg Oral Daily  . Chlorhexidine Gluconate Cloth  6 each Topical Daily  . gabapentin  100 mg Oral BID  . heparin  5,000 Units Subcutaneous 3 times per day  . insulin aspart  0-15 Units Subcutaneous TID WC  . insulin aspart  0-5 Units Subcutaneous QHS  . insulin aspart protamine- aspart  18 Units Subcutaneous BID WC   . mupirocin ointment  1 application Nasal BID  . piperacillin-tazobactam (ZOSYN)  IV  3.375 g Intravenous 3 times per day  . vancomycin  1,250 mg Intravenous Q24H   Continuous Infusions:    Principal Problem:   Cellulitis of right foot Active Problems:   Neuropathic foot ulcer   Essential hypertension   Diabetic neuropathy   CKD (chronic kidney disease)    Time spent: 25 minutes   [facility] Pager [phone]. If 7PM-7AM, please contact night-coverage at [email or web address], password [email or web address] 12/07/2014, 11:47 AM  LOS: 6 days         [facility] PROGRESS NOTE  Juan Werner RXV:[id number] DOB: April 12, 1971 DOA: 12/01/2014 PCP: No primary care provider on file.  Assessment/Plan:  Cellulitis of the right foot with abscess -following puncture wound -on day 6 of IV Vanc/Zosyn, ESR at 125, persistent fevers, improving -MRI done due to persistent fevers and extension of area of erythema, shows possible subcut early abscess, no deep abscess or osteo, -s/p I&D and penrose drain 5/6 early am, appreciate Dr.Graves help -Wound Cx with staph aureus, await sensitivities,  blood culture NGTD -Mobilize, Ambulate using walker -CM consult, needs PCP  Hypertension Continue Norvasc, hold ACE  Diabetes mellitus -hbA1c 8.6 -continue insulin 70/30 to 18 units twice a day , sliding scale insulin  Peripheral neuropathy Continue gabapentin  Chronic kidney disease stage III Creatinine unchanged, likely at baseline, baseline is not available in our system -hold ACE  Nausea/contipation -add miralax and senokot  Code Status: full Family Communication: none at bedside Disposition Plan:  Home when improved    Brief narrative: Juan Werner is a 44 y.o. male with Past medical history of diabetes mellitus hypertension and neuropathy. The patient is presenting with complaints of foot pain on the right. He mentions that 2 days ago while at work he placed his  leg on a nail and had injury. After that he had progressively worsening foot pain. He has swelling of his foot with redness and worsening swelling. Therefore he came to the hospital.  Consultants:  None  Procedures:  None  Antibiotics: Vancomycin/Zosyn   HPI/Subjective: S/p I&D last pm, fevers down  Objective: Filed Vitals:   12/06/14 0437 12/06/14 1332 12/06/14 2135 12/07/14 0546  BP: 134/71 140/72 147/67 142/78  Pulse: 95 84 103 89  Temp: 99.3 F (37.4 C) 98.4 F (36.9 C) 101 F (38.3 C) 98.9 F (37.2 C)  TempSrc: Oral Oral Oral Oral  Resp: $Remo'18 18 18 18  'fLdPz$ Height:      Weight:      SpO2: 93% 94% 94% 92%    Intake/Output Summary (Last 24 hours) at 12/07/14 1148 Last data filed at 12/07/14 0849  Gross per 24 hour  Intake    720 ml  Output      0 ml  Net    720 ml    Exam:  General: No acute respiratory distress, AAOx3 Lungs: Clear to auscultation bilaterally without wheezes or crackles Cardiovascular: Regular rate and rhythm without murmur gallop or rub normal S1 and S2 Abdomen: Nontender, nondistended, soft, bowel sounds positive, no rebound, no ascites, no appreciable mass Extremities: No significant cyanosis, clubbing, or edema bilateral lower extremities Skin: erythema and swelling of R foot beyond the area that was demarcated,  puncture wound on dorsum of the right foot    Data Reviewed: Basic Metabolic Panel:  Recent Labs Lab 12/01/14 1930 12/02/14 0453 12/03/14 0445 12/05/14 0452 12/07/14 0416  NA 136 135 136 136 133*  K 4.3 4.5 4.4 4.2 4.0  CL 104 104 104 103 99*  CO2 24  $'23 24 25 23  'j$ GLUCOSE 208* 231* 162* 96 90  BUN $Re'17 16 19 17 'Pgi$ 25*  CREATININE 1.57* 1.52* 1.69* 1.79* 2.48*  CALCIUM 8.8* 8.3* 8.5* 8.3* 7.8*    Liver Function Tests:  Recent Labs Lab 12/02/14 0453 12/03/14 0445  AST 13* 12*  ALT 12* 10*  ALKPHOS 58 53  BILITOT 0.5 0.5  PROT 5.9* 5.6*  ALBUMIN 2.2* 2.0*   No results for input(s): LIPASE, AMYLASE in the last 168  hours. No results for input(s): AMMONIA in the last 168 hours.  CBC:  Recent Labs Lab 12/01/14 1930 12/02/14 0453 12/03/14 0445 12/05/14 0452 12/07/14 0416  WBC 17.1* 16.0* 12.7* 16.1* 15.4*  NEUTROABS 11.9*  --   --   --   --   HGB 12.3* 11.7* 10.5* 9.6* 8.2*  HCT 36.4* 35.2* 30.9* 28.7* 24.6*  MCV 85.8 85.9 86.8 85.2 86.0  PLT 229 220 211 270 273    Cardiac Enzymes:  Recent Labs Lab 12/01/14 2210  CKTOTAL 105   BNP (last 3 results) No results for input(s): BNP in the last 8760 hours.  ProBNP (last 3 results) No results for input(s): PROBNP in the last 8760 hours.    CBG:  Recent Labs Lab 12/06/14 1141 12/06/14 1735 12/06/14 1834 12/06/14 2129 12/07/14 0718  GLUCAP 129* 170* 155* 120* 119*    Recent Results (from the past 240 hour(s))  Culture, blood (routine x 2)     Status: None (Preliminary result)   Collection Time: 12/01/14 10:00 PM  Result Value Ref Range Status   Specimen Description BLOOD RIGHT HAND  Final   Special Requests   Final    BOTTLES DRAWN AEROBIC AND ANAEROBIC 8CC EA PT ON ZOSYN,VANCOMYCIN   Culture   Final           BLOOD CULTURE RECEIVED NO GROWTH TO DATE CULTURE WILL BE HELD FOR 5 DAYS BEFORE ISSUING A FINAL NEGATIVE REPORT Performed at [facility]    Report Status PENDING  Incomplete  Culture, blood (routine x 2)     Status: None (Preliminary result)   Collection Time: 12/01/14 10:09 PM  Result Value Ref Range Status   Specimen Description BLOOD LEFT ARM  Final   Special Requests   Final    BOTTLES DRAWN AEROBIC ONLY 10CC PT ON ZOSYN,VANCOMYCIN   Culture   Final           BLOOD CULTURE RECEIVED NO GROWTH TO DATE CULTURE WILL BE HELD FOR 5 DAYS BEFORE ISSUING A FINAL NEGATIVE REPORT Note: Culture results may be compromised due to an excessive volume of blood received in culture bottles. Performed at [facility]    Report Status PENDING  Incomplete  Surgical pcr screen     Status: Abnormal   Collection  Time: 12/05/14  4:28 PM  Result Value Ref Range Status   MRSA, PCR NEGATIVE NEGATIVE Final   Staphylococcus aureus POSITIVE (A) NEGATIVE Final    Comment:        The Xpert SA Assay (FDA approved for NASAL specimens in patients over 109 years of age), is one component of a comprehensive surveillance program.  Test performance has been validated by [facility] for patients greater than or equal to 82 year old. It is not intended to diagnose infection nor to guide or monitor treatment.   Culture, routine-abscess     Status: None (Preliminary result)   Collection Time: 12/05/14  8:54 PM  Result Value Ref Range Status  Specimen Description ABSCESS RIGHT FOOT  Final   Special Requests PATIENT ON FOLLOWING ZOSYN AND VANCO  Final   Gram Stain   Final    RARE WBC PRESENT,BOTH PMN AND MONONUCLEAR NO SQUAMOUS EPITHELIAL CELLS SEEN FEW GRAM POSITIVE COCCI IN PAIRS IN CLUSTERS Performed at [facility]    Culture   Final    MODERATE STAPHYLOCOCCUS AUREUS Note: RIFAMPIN AND GENTAMICIN SHOULD NOT BE USED AS SINGLE DRUGS FOR TREATMENT OF STAPH INFECTIONS. Performed at [facility]    Report Status PENDING  Incomplete  Anaerobic culture     Status: None (Preliminary result)   Collection Time: 12/05/14  8:54 PM  Result Value Ref Range Status   Specimen Description ABSCESS RIGHT FOOT  Final   Special Requests PATIENT ON FOLLOWING ZOSYN VANCOMYCIN  Final   Gram Stain   Final    RARE WBC PRESENT,BOTH PMN AND MONONUCLEAR NO SQUAMOUS EPITHELIAL CELLS SEEN FEW GRAM POSITIVE COCCI IN PAIRS IN CLUSTERS Performed at [facility]    Culture   Final    NO ANAEROBES ISOLATED; CULTURE IN PROGRESS FOR 5 DAYS Performed at [facility]    Report Status PENDING  Incomplete     Studies: Dg Abd 1 View  12/03/2014   CLINICAL DATA:  Patient with nausea and vomiting.  EXAM: ABDOMEN - 1 VIEW  COMPARISON:  None.  FINDINGS: Gas is demonstrated within non dilated loops of  large and small bowel in a nonobstructed pattern. Supine evaluation limited for the detection of free intraperitoneal air. Large amount of stool within the transverse and descending colon. Unremarkable osseous skeleton.  IMPRESSION: Nonobstructed bowel gas pattern.   Electronically Signed   By: Lovey Newcomer M.D.   On: 12/03/2014 12:50   Mr Foot Right Wo Contrast  12/05/2014   CLINICAL DATA:  Puncture wound on the volar surface of the right midfoot 2 days ago with redness, swelling and tenderness extending to the medial malleolus.  EXAM: MRI OF THE RIGHT FOREFOOT WITHOUT CONTRAST  TECHNIQUE: Multiplanar, multisequence MR imaging was performed. No intravenous contrast was administered.  COMPARISON:  Plain films right foot 12/01/2014.  FINDINGS: Diffuse subcutaneous edema is seen about the foot and ankle. More focal edema is seen in subcutaneous fat on the plantar surface of the foot where there appears to be a puncture wound. This area of edema measures 2.0 cm long by 2.7 cm transverse by 0.8 cm craniocaudal and may represent phlegmon or early abscess. No intramuscular abscess is identified. There is no bone marrow signal to suggest osteomyelitis. Intrinsic musculature the foot demonstrates mild edema throughout likely due to denervation atrophy.  IMPRESSION: Puncture wound on the plantar surface of the foot with underlying focal edema in subcutaneous fat which may represent phlegmon/early abscess. No deep abscess or evidence of osteomyelitis is identified.  Increased T2 signal throughout intrinsic musculature the foot is likely due to denervation atrophy.   Electronically Signed   By: Inge Rise M.D.   On: 12/05/2014 08:22   Dg Foot Complete Right  12/01/2014   CLINICAL DATA:  Right foot pain stepped on nail 3 days ago redness swelling around foot cellulitis puncture wound lateral aspect first toe  EXAM: RIGHT FOOT COMPLETE - 3+ VIEW  COMPARISON:  None.  FINDINGS: Soft tissue swelling first toe. No fracture  or dislocation. No radiodense foreign body. No evidence of periosteal reaction or osteomyelitis.  IMPRESSION: Soft tissue swelling suggesting cellulitis.   Electronically Signed   By: Kyung Rudd  Rubner M.D.   On: 12/01/2014 20:18    Scheduled Meds: . amLODipine  10 mg Oral Daily  . Chlorhexidine Gluconate Cloth  6 each Topical Daily  . gabapentin  100 mg Oral BID  . heparin  5,000 Units Subcutaneous 3 times per day  . insulin aspart  0-15 Units Subcutaneous TID WC  . insulin aspart  0-5 Units Subcutaneous QHS  . insulin aspart protamine- aspart  18 Units Subcutaneous BID WC  . mupirocin ointment  1 application Nasal BID  . piperacillin-tazobactam (ZOSYN)  IV  3.375 g Intravenous 3 times per day  . vancomycin  1,250 mg Intravenous Q24H   Continuous Infusions:    Principal Problem:   Cellulitis of right foot Active Problems:   Neuropathic foot ulcer   Essential hypertension   Diabetic neuropathy   CKD (chronic kidney disease)    Time spent: 25 minutes   [facility] Pager [phone]. If 7PM-7AM, please contact night-coverage at [email or web address], password [facility] 12/07/2014, 11:48 AM  LOS: 6 days

## 2014-12-08 DIAGNOSIS — L02619 Cutaneous abscess of unspecified foot: Secondary | ICD-10-CM

## 2014-12-08 LAB — CULTURE, ROUTINE-ABSCESS

## 2014-12-08 LAB — CULTURE, BLOOD (ROUTINE X 2)
Culture: NO GROWTH
Culture: NO GROWTH

## 2014-12-08 LAB — GLUCOSE, CAPILLARY
GLUCOSE-CAPILLARY: 90 mg/dL (ref 70–99)
GLUCOSE-CAPILLARY: 113 mg/dL — AB (ref 70–99)
Glucose-Capillary: 189 mg/dL — ABNORMAL HIGH (ref 70–99)
Glucose-Capillary: 122 mg/dL — ABNORMAL HIGH (ref 70–99)

## 2014-12-08 LAB — VANCOMYCIN, TROUGH: VANCOMYCIN TR: 35 ug/mL — AB (ref 10.0–20.0)

## 2014-12-08 MED ORDER — INSULIN ASPART PROT & ASPART (70-30 MIX) 100 UNIT/ML ~~LOC~~ SUSP
16.0000 [IU] | Freq: Two times a day (BID) | SUBCUTANEOUS | Status: DC
  Administered 2014-12-08 – 2014-12-10 (×5): 16 [IU] via SUBCUTANEOUS

## 2014-12-08 NOTE — Progress Notes (Signed)
PATIENT ID: Juan Werner  MRN: [id number]  DOB/AGE:  11/19/1970 / 44 y.o.  3 Days Post-Op Procedure(s) (LRB): IRRIGATION AND DEBRIDEMENT OF FOOT (Right)    PROGRESS NOTE Subjective:   Patient is alert, oriented, no Nausea, no Vomiting, yes passing gas, yes Bowel Movement. Taking PO well. Denies SOB, Chest or Calf Pain. Using Incentive Spirometer, PAS in place. Ambulate non-weight bearing with crutches , Patient reports pain as mild,     Objective: Vital signs in last 24 hours: Temp:  [98.4 F (36.9 C)-99.2 F (37.3 C)] 98.4 F (36.9 C) (05/08 0600) Pulse Rate:  [84-99] 87 (05/08 0600) Resp:  [18-20] 20 (05/08 0600) BP: (140-165)/(72-84) 165/84 mmHg (05/08 0600) SpO2:  [93 %-95 %] 93 % (05/08 0600)    Intake/Output from previous day: I/O last 3 completed shifts: In: 1550 [P.O.:1200; IV Piggyback:350] Out: 300 [Urine:300]   Intake/Output this shift:     LABORATORY DATA:  Recent Labs  12/07/14 0416  12/07/14 1637 12/07/14 2157 12/08/14 0755  WBC 15.4*  --   --   --   --   HGB 8.2*  --   --   --   --   HCT 24.6*  --   --   --   --   PLT 273  --   --   --   --   NA 133*  --   --   --   --   K 4.0  --   --   --   --   CL 99*  --   --   --   --   CO2 23  --   --   --   --   BUN 25*  --   --   --   --   CREATININE 2.48*  --   --   --   --   GLUCOSE 90  --   --   --   --   GLUCAP  --   < > 92 75 90  CALCIUM 7.8*  --   --   --   --   < > = values in this interval not displayed.  Examination: Neurologically intact Neurovascular intact Sensation intact distally Intact pulses distally Dorsiflexion/Plantar flexion intact decreased cellulitis}and decreased swelling  Assessment:   3 Days Post-Op Procedure(s) (LRB): IRRIGATION AND DEBRIDEMENT OF FOOT (Right) ADDITIONAL DIAGNOSIS:    Plan:  Non Weight Bearing (NWB)  DISCHARGE PLAN: Home when cleared by medicine.  DISCHARGE NEEDS: Walker   Continue IV antibiotics pending culture and sensitivities    [name],  Bonnita Newby R 12/08/2014, 9:29 AM

## 2014-12-08 NOTE — Progress Notes (Signed)
ANTIBIOTIC CONSULT NOTE - FOLLOW UP  Pharmacy Consult for vancomycin Indication: cellulitis  No Known Allergies  Patient Measurements: Height: 5\' 6"  (167.6 cm) Weight: 152 lb 8.9 oz (69.2 kg) IBW/kg (Calculated) : 63.8  Vital Signs: Temp: 99.2 F (37.3 C) (05/07 2100) Temp Source: Oral (05/07 2100) BP: 163/77 mmHg (05/07 2100) Pulse Rate: 99 (05/07 2100) Intake/Output from previous day: 05/07 0701 - 05/08 0700 In: 1070 [P.O.:720; IV Piggyback:350] Out: 300 [Urine:300] Intake/Output from this shift: Total I/O In: -  Out: 300 [Urine:300]  Labs:  Recent Labs  12/05/14 0452 12/07/14 0416  WBC 16.1* 15.4*  HGB 9.6* 8.2*  PLT 270 273  CREATININE 1.79* 2.48*   Estimated Creatinine Clearance: 34.7 mL/min (by C-G formula based on Cr of 2.48).  Recent Labs  12/07/14 2240  VANCOTROUGH 35*     Microbiology: Recent Results (from the past 720 hour(s))  Culture, blood (routine x 2)     Status: None (Preliminary result)   Collection Time: 12/01/14 10:00 PM  Result Value Ref Range Status   Specimen Description BLOOD RIGHT HAND  Final   Special Requests   Final    BOTTLES DRAWN AEROBIC AND ANAEROBIC 8CC EA PT ON ZOSYN,VANCOMYCIN   Culture   Final           BLOOD CULTURE RECEIVED NO GROWTH TO DATE CULTURE WILL BE HELD FOR 5 DAYS BEFORE ISSUING A FINAL NEGATIVE REPORT Performed at [facility]    Report Status PENDING  Incomplete  Culture, blood (routine x 2)     Status: None (Preliminary result)   Collection Time: 12/01/14 10:09 PM  Result Value Ref Range Status   Specimen Description BLOOD LEFT ARM  Final   Special Requests   Final    BOTTLES DRAWN AEROBIC ONLY 10CC PT ON ZOSYN,VANCOMYCIN   Culture   Final           BLOOD CULTURE RECEIVED NO GROWTH TO DATE CULTURE WILL BE HELD FOR 5 DAYS BEFORE ISSUING A FINAL NEGATIVE REPORT Note: Culture results may be compromised due to an excessive volume of blood received in culture bottles. Performed at [facility]    Report Status PENDING  Incomplete  Surgical pcr screen     Status: Abnormal   Collection Time: 12/05/14  4:28 PM  Result Value Ref Range Status   MRSA, PCR NEGATIVE NEGATIVE Final   Staphylococcus aureus POSITIVE (A) NEGATIVE Final    Comment:        The Xpert SA Assay (FDA approved for NASAL specimens in patients over 43 years of age), is one component of a comprehensive surveillance program.  Test performance has been validated by [facility] for patients greater than or equal to 43 year old. It is not intended to diagnose infection nor to guide or monitor treatment.   Culture, routine-abscess     Status: None (Preliminary result)   Collection Time: 12/05/14  8:54 PM  Result Value Ref Range Status   Specimen Description ABSCESS RIGHT FOOT  Final   Special Requests PATIENT ON FOLLOWING ZOSYN AND VANCO  Final   Gram Stain   Final    RARE WBC PRESENT,BOTH PMN AND MONONUCLEAR NO SQUAMOUS EPITHELIAL CELLS SEEN FEW GRAM POSITIVE COCCI IN PAIRS IN CLUSTERS Performed at [facility]    Culture   Final    MODERATE STAPHYLOCOCCUS AUREUS Note: RIFAMPIN AND GENTAMICIN SHOULD NOT BE USED AS SINGLE DRUGS FOR TREATMENT OF STAPH INFECTIONS. Performed at [facility]  Report Status PENDING  Incomplete  Anaerobic culture     Status: None (Preliminary result)   Collection Time: 12/05/14  8:54 PM  Result Value Ref Range Status   Specimen Description ABSCESS RIGHT FOOT  Final   Special Requests PATIENT ON FOLLOWING ZOSYN VANCOMYCIN  Final   Gram Stain   Final    RARE WBC PRESENT,BOTH PMN AND MONONUCLEAR NO SQUAMOUS EPITHELIAL CELLS SEEN FEW GRAM POSITIVE COCCI IN PAIRS IN CLUSTERS Performed at [facility]    Culture   Final    NO ANAEROBES ISOLATED; CULTURE IN PROGRESS FOR 5 DAYS Performed at [facility]    Report Status PENDING  Incomplete     Assessment: 44yo male supratherapeutic on vancomycin with initial dosing for  cellulitis, now also s/p I&D of abscess; cx growing staph aureus, sensitivities pending; one dose of vanc was given after lab drawn but before resulted.  Goal of Therapy:  Vancomycin trough level 10-15 mcg/ml  Plan:  Will hold off on further doses of vanc for now and recheck level tomorrow am.  Vernard GamblesVeronda Shewanda Sharpe, PharmD, BCPS  12/08/2014,2:58 AM

## 2014-12-08 NOTE — Progress Notes (Addendum)
[facility] PROGRESS NOTE  Juan Werner ZOX:[id number] DOB: 01/01/1971 DOA: 12/01/2014 PCP: No primary care provider on file.  Assessment/Plan:  Cellulitis of the right foot with abscess -following puncture wound -on day 7 of IV Vanc/Zosyn, pending wound CX results - fever improving -MRI done due to persistent fevers and extension of area of erythema, shows possible subcut early abscess, no deep abscess or osteo, -s/p I&D and penrose drain 5/6 early am, appreciate Dr.Graves help -Wound Cx with staph aureus, await sensitivities,  blood culture NGTD -Mobilize, Ambulate using walker -CM consult, needs PCP  AKi on CKD VAnc toxicity and was on ACE till 5/6 Hydrate, hold Vanc, Bmet in am -hold ACE  Hypertension Continue Norvasc, hold ACE  Diabetes mellitus -hbA1c 8.6 -cut down insulin 70/30 to 16 units twice a day , sliding scale insulin  Peripheral neuropathy Continue gabapentin  Nausea/contipation -reoslved -with  miralax and senokot  Code Status: full Family Communication: none at bedside Disposition Plan:  Home tomorrow   Brief narrative: Juan Lungerrnesto Fay is a 44 y.o. male with Past medical history of diabetes mellitus hypertension and neuropathy. The patient is presenting with complaints of foot pain on the right. He mentions that 2 days ago while at work he placed his leg on a nail and had injury. After that he had progressively worsening foot pain. He has swelling of his foot with redness and worsening swelling. Therefore he came to the hospital.  Consultants:  None  Procedures:  None  Antibiotics: Vancomycin/Zosyn   HPI/Subjective: BM yesterday, no complains  Objective: Filed Vitals:   12/07/14 0546 12/07/14 1348 12/07/14 2100 12/08/14 0600  BP: 142/78 140/72 163/77 165/84  Pulse: 89 84 99 87  Temp: 98.9 F (37.2 C) 98.4 F (36.9 C) 99.2 F (37.3 C) 98.4 F (36.9 C)  TempSrc: Oral Oral Oral Oral  Resp: 18 18 19 20   Height:       Weight:      SpO2: 92% 95% 94% 93%    Intake/Output Summary (Last 24 hours) at 12/08/14 1028 Last data filed at 12/08/14 0330  Gross per 24 hour  Intake   1070 ml  Output    300 ml  Net    770 ml    Exam:  General: AAOx3, no distress Lungs: Clear to auscultation bilaterally without wheezes or crackles Cardiovascular: Regular rate and rhythm without murmur gallop or rub normal S1 and S2 Abdomen: Nontender, nondistended, soft, bowel sounds positive, no rebound, no ascites, no appreciable mass Extremities: R Foot with dressing   Data Reviewed: Basic Metabolic Panel:  Recent Labs Lab 12/01/14 1930 12/02/14 0453 12/03/14 0445 12/05/14 0452 12/07/14 0416  NA 136 135 136 136 133*  K 4.3 4.5 4.4 4.2 4.0  CL 104 104 104 103 99*  CO2 24 23 24 25 23   GLUCOSE 208* 231* 162* 96 90  BUN 17 16 19 17  25*  CREATININE 1.57* 1.52* 1.69* 1.79* 2.48*  CALCIUM 8.8* 8.3* 8.5* 8.3* 7.8*    Liver Function Tests:  Recent Labs Lab 12/02/14 0453 12/03/14 0445  AST 13* 12*  ALT 12* 10*  ALKPHOS 58 53  BILITOT 0.5 0.5  PROT 5.9* 5.6*  ALBUMIN 2.2* 2.0*   No results for input(s): LIPASE, AMYLASE in the last 168 hours. No results for input(s): AMMONIA in the last 168 hours.  CBC:  Recent Labs Lab 12/01/14 1930 12/02/14 0453 12/03/14 0445 12/05/14 0452 12/07/14 0416  WBC 17.1* 16.0* 12.7* 16.1* 15.4*  NEUTROABS 11.9*  --   --   --   --  HGB 12.3* 11.7* 10.5* 9.6* 8.2*  HCT 36.4* 35.2* 30.9* 28.7* 24.6*  MCV 85.8 85.9 86.8 85.2 86.0  PLT 229 220 211 270 273    Cardiac Enzymes:  Recent Labs Lab 12/01/14 2210  CKTOTAL 105   BNP (last 3 results) No results for input(s): BNP in the last 8760 hours.  ProBNP (last 3 results) No results for input(s): PROBNP in the last 8760 hours.    CBG:  Recent Labs Lab 12/07/14 0718 12/07/14 1151 12/07/14 1637 12/07/14 2157 12/08/14 0755  GLUCAP 119* 206* 92 75 90    Recent Results (from the past 240 hour(s))   Culture, blood (routine x 2)     Status: None   Collection Time: 12/01/14 10:00 PM  Result Value Ref Range Status   Specimen Description BLOOD RIGHT HAND  Final   Special Requests   Final    BOTTLES DRAWN AEROBIC AND ANAEROBIC 8CC EA PT ON ZOSYN,VANCOMYCIN   Culture   Final    NO GROWTH 5 DAYS Performed at [facility]    Report Status 12/08/2014 FINAL  Final  Culture, blood (routine x 2)     Status: None   Collection Time: 12/01/14 10:09 PM  Result Value Ref Range Status   Specimen Description BLOOD LEFT ARM  Final   Special Requests   Final    BOTTLES DRAWN AEROBIC ONLY 10CC PT ON ZOSYN,VANCOMYCIN   Culture   Final    NO GROWTH 5 DAYS Note: Culture results may be compromised due to an excessive volume of blood received in culture bottles. Performed at [facility]    Report Status 12/08/2014 FINAL  Final  Surgical pcr screen     Status: Abnormal   Collection Time: 12/05/14  4:28 PM  Result Value Ref Range Status   MRSA, PCR NEGATIVE NEGATIVE Final   Staphylococcus aureus POSITIVE (A) NEGATIVE Final    Comment:        The Xpert SA Assay (FDA approved for NASAL specimens in patients over 33 years of age), is one component of a comprehensive surveillance program.  Test performance has been validated by [facility] for patients greater than or equal to 70 year old. It is not intended to diagnose infection nor to guide or monitor treatment.   Culture, routine-abscess     Status: None (Preliminary result)   Collection Time: 12/05/14  8:54 PM  Result Value Ref Range Status   Specimen Description ABSCESS RIGHT FOOT  Final   Special Requests PATIENT ON FOLLOWING ZOSYN AND VANCO  Final   Gram Stain   Final    RARE WBC PRESENT,BOTH PMN AND MONONUCLEAR NO SQUAMOUS EPITHELIAL CELLS SEEN FEW GRAM POSITIVE COCCI IN PAIRS IN CLUSTERS Performed at [facility]    Culture   Final    MODERATE STAPHYLOCOCCUS AUREUS Note: RIFAMPIN AND GENTAMICIN SHOULD  NOT BE USED AS SINGLE DRUGS FOR TREATMENT OF STAPH INFECTIONS. Performed at [facility]    Report Status PENDING  Incomplete  Anaerobic culture     Status: None (Preliminary result)   Collection Time: 12/05/14  8:54 PM  Result Value Ref Range Status   Specimen Description ABSCESS RIGHT FOOT  Final   Special Requests PATIENT ON FOLLOWING ZOSYN VANCOMYCIN  Final   Gram Stain   Final    RARE WBC PRESENT,BOTH PMN AND MONONUCLEAR NO SQUAMOUS EPITHELIAL CELLS SEEN FEW GRAM POSITIVE COCCI IN PAIRS IN CLUSTERS Performed at [facility]  Final    NO ANAEROBES ISOLATED; CULTURE IN PROGRESS FOR 5 DAYS Performed at [facility]    Report Status PENDING  Incomplete     Studies: Dg Abd 1 View  12/03/2014   CLINICAL DATA:  Patient with nausea and vomiting.  EXAM: ABDOMEN - 1 VIEW  COMPARISON:  None.  FINDINGS: Gas is demonstrated within non dilated loops of large and small bowel in a nonobstructed pattern. Supine evaluation limited for the detection of free intraperitoneal air. Large amount of stool within the transverse and descending colon. Unremarkable osseous skeleton.  IMPRESSION: Nonobstructed bowel gas pattern.   Electronically Signed   By: Annia Belt M.D.   On: 12/03/2014 12:50   Mr Foot Right Wo Contrast  12/05/2014   CLINICAL DATA:  Puncture wound on the volar surface of the right midfoot 2 days ago with redness, swelling and tenderness extending to the medial malleolus.  EXAM: MRI OF THE RIGHT FOREFOOT WITHOUT CONTRAST  TECHNIQUE: Multiplanar, multisequence MR imaging was performed. No intravenous contrast was administered.  COMPARISON:  Plain films right foot 12/01/2014.  FINDINGS: Diffuse subcutaneous edema is seen about the foot and ankle. More focal edema is seen in subcutaneous fat on the plantar surface of the foot where there appears to be a puncture wound. This area of edema measures 2.0 cm long by 2.7 cm transverse by 0.8 cm craniocaudal and may  represent phlegmon or early abscess. No intramuscular abscess is identified. There is no bone marrow signal to suggest osteomyelitis. Intrinsic musculature the foot demonstrates mild edema throughout likely due to denervation atrophy.  IMPRESSION: Puncture wound on the plantar surface of the foot with underlying focal edema in subcutaneous fat which may represent phlegmon/early abscess. No deep abscess or evidence of osteomyelitis is identified.  Increased T2 signal throughout intrinsic musculature the foot is likely due to denervation atrophy.   Electronically Signed   By: Drusilla Kanner M.D.   On: 12/05/2014 08:22   Dg Foot Complete Right  12/01/2014   CLINICAL DATA:  Right foot pain stepped on nail 3 days ago redness swelling around foot cellulitis puncture wound lateral aspect first toe  EXAM: RIGHT FOOT COMPLETE - 3+ VIEW  COMPARISON:  None.  FINDINGS: Soft tissue swelling first toe. No fracture or dislocation. No radiodense foreign body. No evidence of periosteal reaction or osteomyelitis.  IMPRESSION: Soft tissue swelling suggesting cellulitis.   Electronically Signed   By: Esperanza Heir M.D.   On: 12/01/2014 20:18    Scheduled Meds: . amLODipine  10 mg Oral Daily  . Chlorhexidine Gluconate Cloth  6 each Topical Daily  . gabapentin  100 mg Oral BID  . heparin  5,000 Units Subcutaneous 3 times per day  . insulin aspart  0-15 Units Subcutaneous TID WC  . insulin aspart  0-5 Units Subcutaneous QHS  . insulin aspart protamine- aspart  18 Units Subcutaneous BID WC  . mupirocin ointment  1 application Nasal BID  . piperacillin-tazobactam (ZOSYN)  IV  3.375 g Intravenous 3 times per day  . polyethylene glycol  17 g Oral Daily  . senna-docusate  1 tablet Oral BID   Continuous Infusions:    Principal Problem:   Cellulitis of right foot Active Problems:   Neuropathic foot ulcer   Essential hypertension   Diabetic neuropathy   CKD (chronic kidney disease)   Abscess of foot    Time  spent: 25 minutes   [facility]  [facility] Pager [phone]. If 7PM-7AM, please contact night-coverage  at [email or web address], password [email or web address] 12/08/2014, 10:28 AM  LOS: 7 days

## 2014-12-09 ENCOUNTER — Encounter (HOSPITAL_COMMUNITY): Payer: Self-pay | Admitting: Orthopedic Surgery

## 2014-12-09 LAB — GLUCOSE, CAPILLARY
GLUCOSE-CAPILLARY: 149 mg/dL — AB (ref 70–99)
Glucose-Capillary: 99 mg/dL (ref 70–99)
Glucose-Capillary: 156 mg/dL — ABNORMAL HIGH (ref 70–99)
Glucose-Capillary: 77 mg/dL (ref 70–99)

## 2014-12-09 LAB — VANCOMYCIN, RANDOM: Vancomycin Rm: 24 ug/mL

## 2014-12-09 LAB — BASIC METABOLIC PANEL
Anion gap: 8 (ref 5–15)
BUN: 24 mg/dL — AB (ref 6–20)
CO2: 24 mmol/L (ref 22–32)
Calcium: 8.3 mg/dL — ABNORMAL LOW (ref 8.9–10.3)
Chloride: 107 mmol/L (ref 101–111)
Creatinine, Ser: 2.48 mg/dL — ABNORMAL HIGH (ref 0.61–1.24)
GFR calc Af Amer: 35 mL/min — ABNORMAL LOW (ref >60)
GFR, EST NON AFRICAN AMERICAN: 30 mL/min — AB (ref >60)
Glucose, Bld: 110 mg/dL — ABNORMAL HIGH (ref 70–99)
Potassium: 4.3 mmol/L (ref 3.5–5.1)
Sodium: 139 mmol/L (ref 135–145)

## 2014-12-09 LAB — CBC
HCT: 25.3 % — ABNORMAL LOW (ref 39.0–52.0)
Hemoglobin: 8.5 g/dL — ABNORMAL LOW (ref 13.0–17.0)
MCH: 29 pg (ref 26.0–34.0)
MCHC: 33.6 g/dL (ref 30.0–36.0)
MCV: 86.3 fL (ref 78.0–100.0)
Platelets: 363 10*3/uL (ref 150–400)
RBC: 2.93 MIL/uL — ABNORMAL LOW (ref 4.22–5.81)
RDW: 12.6 % (ref 11.5–15.5)
WBC: 12.8 10*3/uL — AB (ref 4.0–10.5)

## 2014-12-09 MED ORDER — PNEUMOCOCCAL VAC POLYVALENT 25 MCG/0.5ML IJ INJ: 0.5000 mL | INJECTION | INTRAMUSCULAR | Status: DC

## 2014-12-09 MED ORDER — CEPHALEXIN 500 MG PO CAPS
500.0000 mg | ORAL_CAPSULE | Freq: Three times a day (TID) | ORAL | Status: DC
  Administered 2014-12-09 – 2014-12-10 (×5): 500 mg via ORAL
  Filled 2014-12-09 (×5): qty 1

## 2014-12-09 MED ORDER — SODIUM CHLORIDE 0.9 % IV SOLN
INTRAVENOUS | Status: DC
  Administered 2014-12-09 – 2014-12-10 (×2): via INTRAVENOUS

## 2014-12-09 NOTE — Progress Notes (Signed)
[facility] PROGRESS NOTE  Juan Werner GNF:[id number] DOB: 07/25/1971 DOA: 12/01/2014 PCP: No primary care provider on file.  Assessment/Plan:  Cellulitis of the right foot with abscess -following puncture wound,MRI -possible subcut early abscess, no deep abscess or osteo, -s/p I&D and penrose drain 5/6 early am, appreciate Dr.Graves help -s/p 7 days of of IV Vanc/Zosyn, Wound Cx with MSSA -change to PO Keflex today -Mobilize, Ambulate using walker -CM consult, needs PCP-CM made FU at [facility] for 5/13  AKi on CKD VAnc toxicity and was on ACE till 5/6 -Hydrate, Vanc on hold, level down -check Bmet in am -creatinine stable from yetserday   Hypertension Continue Norvasc, hold ACE  Diabetes mellitus -hbA1c 8.6 -cut down insulin 70/30 to 16 units twice a day , sliding scale insulin  Peripheral neuropathy Continue gabapentin  Nausea/contipation -reoslved -with miralax and senokot  Code Status: full Family Communication: none at bedside Disposition Plan:  Home tomorrow   Brief narrative: Juan Lungerrnesto Oetken is a 44 y.o. male with Past medical history of diabetes mellitus hypertension and neuropathy. The patient is presenting with complaints of foot pain on the right. He mentions that 2 days ago while at work he placed his leg on a nail and had injury. After that he had progressively worsening foot pain. He has swelling of his foot with redness and worsening swelling. Therefore he came to the hospital.  Consultants:  None  Procedures:  None  Antibiotics: Vancomycin/Zosyn   HPI/Subjective: BM yesterday and today, ambulating with walker  Objective: Filed Vitals:   12/08/14 0600 12/08/14 1345 12/08/14 2104 12/09/14 0505  BP: 165/84 150/79 161/77 144/78  Pulse: 87 83 82 71  Temp: 98.4 F (36.9 C) 98.9 F (37.2 C) 98.8 F (37.1 C) 98.4 F (36.9 C)  TempSrc: Oral Oral Oral Oral  Resp: 20 18 17 17   Height:      Weight:       SpO2: 93% 96% 96% 97%    Intake/Output Summary (Last 24 hours) at 12/09/14 1143 Last data filed at 12/09/14 0935  Gross per 24 hour  Intake    560 ml  Output   1525 ml  Net   -965 ml    Exam:  General: AAOx3, no distress, pleasant this am Lungs: Clear to auscultation bilaterally without wheezes or crackles Cardiovascular: Regular rate and rhythm without murmur gallop or rub normal S1 and S2 Abdomen: Nontender, nondistended, soft, bowel sounds positive, no rebound, no ascites,  Extremities: R Foot with dressing   Data Reviewed: Basic Metabolic Panel:  Recent Labs Lab 12/03/14 0445 12/05/14 0452 12/07/14 0416 12/09/14 0615  NA 136 136 133* 139  K 4.4 4.2 4.0 4.3  CL 104 103 99* 107  CO2 24 25 23 24   GLUCOSE 162* 96 90 110*  BUN 19 17 25* 24*  CREATININE 1.69* 1.79* 2.48* 2.48*  CALCIUM 8.5* 8.3* 7.8* 8.3*    Liver Function Tests:  Recent Labs Lab 12/03/14 0445  AST 12*  ALT 10*  ALKPHOS 53  BILITOT 0.5  PROT 5.6*  ALBUMIN 2.0*   No results for input(s): LIPASE, AMYLASE in the last 168 hours. No results for input(s): AMMONIA in the last 168 hours.  CBC:  Recent Labs Lab 12/03/14 0445 12/05/14 0452 12/07/14 0416 12/09/14 0615  WBC 12.7* 16.1* 15.4* 12.8*  HGB 10.5* 9.6* 8.2* 8.5*  HCT 30.9* 28.7* 24.6* 25.3*  MCV 86.8 85.2 86.0 86.3  PLT 211 270 273 363    Cardiac Enzymes: No  results for input(s): CKTOTAL, CKMB, CKMBINDEX, TROPONINI in the last 168 hours. BNP (last 3 results) No results for input(s): BNP in the last 8760 hours.  ProBNP (last 3 results) No results for input(s): PROBNP in the last 8760 hours.    CBG:  Recent Labs Lab 12/08/14 1215 12/08/14 1739 12/08/14 2109 12/09/14 0714 12/09/14 1133  GLUCAP 113* 189* 122* 99 149*    Recent Results (from the past 240 hour(s))  Culture, blood (routine x 2)     Status: None   Collection Time: 12/01/14 10:00 PM  Result Value Ref Range Status   Specimen Description BLOOD RIGHT  HAND  Final   Special Requests   Final    BOTTLES DRAWN AEROBIC AND ANAEROBIC 8CC EA PT ON ZOSYN,VANCOMYCIN   Culture   Final    NO GROWTH 5 DAYS Performed at [facility]    Report Status 12/08/2014 FINAL  Final  Culture, blood (routine x 2)     Status: None   Collection Time: 12/01/14 10:09 PM  Result Value Ref Range Status   Specimen Description BLOOD LEFT ARM  Final   Special Requests   Final    BOTTLES DRAWN AEROBIC ONLY 10CC PT ON ZOSYN,VANCOMYCIN   Culture   Final    NO GROWTH 5 DAYS Note: Culture results may be compromised due to an excessive volume of blood received in culture bottles. Performed at [facility]    Report Status 12/08/2014 FINAL  Final  Surgical pcr screen     Status: Abnormal   Collection Time: 12/05/14  4:28 PM  Result Value Ref Range Status   MRSA, PCR NEGATIVE NEGATIVE Final   Staphylococcus aureus POSITIVE (A) NEGATIVE Final    Comment:        The Xpert SA Assay (FDA approved for NASAL specimens in patients over 19 years of age), is one component of a comprehensive surveillance program.  Test performance has been validated by [facility] for patients greater than or equal to 69 year old. It is not intended to diagnose infection nor to guide or monitor treatment.   Culture, routine-abscess     Status: None   Collection Time: 12/05/14  8:54 PM  Result Value Ref Range Status   Specimen Description ABSCESS RIGHT FOOT  Final   Special Requests PATIENT ON FOLLOWING ZOSYN AND VANCO  Final   Gram Stain   Final    RARE WBC PRESENT,BOTH PMN AND MONONUCLEAR NO SQUAMOUS EPITHELIAL CELLS SEEN FEW GRAM POSITIVE COCCI IN PAIRS IN CLUSTERS Performed at [facility]    Culture   Final    MODERATE STAPHYLOCOCCUS AUREUS Note: RIFAMPIN AND GENTAMICIN SHOULD NOT BE USED AS SINGLE DRUGS FOR TREATMENT OF STAPH INFECTIONS. Performed at [facility]    Report Status 12/08/2014 FINAL  Final   Organism ID, Bacteria  STAPHYLOCOCCUS AUREUS  Final      Susceptibility   Staphylococcus aureus - MIC*    CLINDAMYCIN <=0.25 SENSITIVE Sensitive     ERYTHROMYCIN <=0.25 SENSITIVE Sensitive     GENTAMICIN <=0.5 SENSITIVE Sensitive     LEVOFLOXACIN <=0.12 SENSITIVE Sensitive     OXACILLIN 0.5 SENSITIVE Sensitive     PENICILLIN >=0.5 RESISTANT Resistant     RIFAMPIN <=0.5 SENSITIVE Sensitive     TRIMETH/SULFA <=10 SENSITIVE Sensitive     VANCOMYCIN 1 SENSITIVE Sensitive     TETRACYCLINE <=1 SENSITIVE Sensitive     MOXIFLOXACIN <=0.25 SENSITIVE Sensitive     * MODERATE STAPHYLOCOCCUS AUREUS  Anaerobic culture     Status: None (Preliminary result)   Collection Time: 12/05/14  8:54 PM  Result Value Ref Range Status   Specimen Description ABSCESS RIGHT FOOT  Final   Special Requests PATIENT ON FOLLOWING ZOSYN VANCOMYCIN  Final   Gram Stain   Final    RARE WBC PRESENT,BOTH PMN AND MONONUCLEAR NO SQUAMOUS EPITHELIAL CELLS SEEN FEW GRAM POSITIVE COCCI IN PAIRS IN CLUSTERS [facility]    Culture   Final    NO ANAEROBES ISOLATED; CULTURE IN PROGRESS FOR 5 DAYS [facility]formed at [facility]    Report Status PENDING  Incomplete     Studies: Dg Abd 1 View  12/03/2014   CLINICAL DATA:  Patient with nausea and vomiting.  EXAM: ABDOMEN - 1 VIEW  COMPARISON:  None.  FINDINGS: Gas is demonstrated within non dilated loops of large and small bowel in a nonobstructed pattern. Supine evaluation limited for the detection of free intraperitoneal air. Large amount of stool within the transverse and descending colon. Unremarkable osseous skeleton.  IMPRESSION: Nonobstructed bowel gas pattern.   Electronically Signed   By: Annia Beltrew  Davis M.D.   On: 12/03/2014 12:50   Mr Foot Right Wo Contrast  12/05/2014   CLINICAL DATA:  Puncture wound on the volar surface of the right midfoot 2 days ago with redness, swelling and tenderness extending to the medial malleolus.  EXAM: MRI OF THE RIGHT FOREFOOT WITHOUT  CONTRAST  TECHNIQUE: Multiplanar, multisequence MR imaging was performed. No intravenous contrast was administered.  COMPARISON:  Plain films right foot 12/01/2014.  FINDINGS: Diffuse subcutaneous edema is seen about the foot and ankle. More focal edema is seen in subcutaneous fat on the plantar surface of the foot where there appears to be a puncture wound. This area of edema measures 2.0 cm long by 2.7 cm transverse by 0.8 cm craniocaudal and may represent phlegmon or early abscess. No intramuscular abscess is identified. There is no bone marrow signal to suggest osteomyelitis. Intrinsic musculature the foot demonstrates mild edema throughout likely due to denervation atrophy.  IMPRESSION: Puncture wound on the plantar surface of the foot with underlying focal edema in subcutaneous fat which may represent phlegmon/early abscess. No deep abscess or evidence of osteomyelitis is identified.  Increased T2 signal throughout intrinsic musculature the foot is likely due to denervation atrophy.   Electronically Signed   By: Drusilla Kannerhomas  Dalessio M.D.   On: 12/05/2014 08:22   Dg Foot Complete Right  12/01/2014   CLINICAL DATA:  Right foot pain stepped on nail 3 days ago redness swelling around foot cellulitis puncture wound lateral aspect first toe  EXAM: RIGHT FOOT COMPLETE - 3+ VIEW  COMPARISON:  None.  FINDINGS: Soft tissue swelling first toe. No fracture or dislocation. No radiodense foreign body. No evidence of periosteal reaction or osteomyelitis.  IMPRESSION: Soft tissue swelling suggesting cellulitis.   Electronically Signed   By: Esperanza Heiraymond  Rubner M.D.   On: 12/01/2014 20:18    Scheduled Meds: . amLODipine  10 mg Oral Daily  . cephALEXin  500 mg Oral 3 times per day  . Chlorhexidine Gluconate Cloth  6 each Topical Daily  . gabapentin  100 mg Oral BID  . heparin  5,000 Units Subcutaneous 3 times per day  . insulin aspart  0-15 Units Subcutaneous TID WC  . insulin aspart  0-5 Units Subcutaneous QHS  . insulin  aspart protamine- aspart  16 Units Subcutaneous BID WC  . mupirocin ointment  1  application Nasal BID  . polyethylene glycol  17 g Oral Daily  . senna-docusate  1 tablet Oral BID   Continuous Infusions:    Principal Problem:   Cellulitis of right foot Active Problems:   Neuropathic foot ulcer   Essential hypertension   Diabetic neuropathy   CKD (chronic kidney disease)   Abscess of foot    Time spent: 25 minutes   [facility]  [facility] Pager [phone]. If 7PM-7AM, please contact night-coverage at [email or web address], password [facility] 12/09/2014, 11:43 AM  LOS: 8 days

## 2014-12-09 NOTE — Progress Notes (Signed)
Subjective: 4 Days Post-Op Procedure(s) (LRB): IRRIGATION AND DEBRIDEMENT OF FOOT (Right) Patient reports pain as mild.    Objective: Vital signs in last 24 hours: Temp:  [98.4 F (36.9 C)-98.9 F (37.2 C)] 98.4 F (36.9 C) (05/09 0505) Pulse Rate:  [71-83] 71 (05/09 0505) Resp:  [17-18] 17 (05/09 0505) BP: (144-161)/(77-79) 144/78 mmHg (05/09 0505) SpO2:  [96 %-97 %] 97 % (05/09 0505)  Intake/Output from previous day: 05/08 0701 - 05/09 0700 In: 320 [P.O.:120; IV Piggyback:200] Out: 1525 [Urine:1525] Intake/Output this shift: Total I/O In: 240 [P.O.:240] Out: 300 [Urine:300]   Recent Labs  12/07/14 0416 12/09/14 0615  HGB 8.2* 8.5*    Recent Labs  12/07/14 0416 12/09/14 0615  WBC 15.4* 12.8*  RBC 2.86* 2.93*  HCT 24.6* 25.3*  PLT 273 363    Recent Labs  12/07/14 0416 12/09/14 0615  NA 133* 139  K 4.0 4.3  CL 99* 107  CO2 23 24  BUN 25* 24*  CREATININE 2.48* 2.48*  GLUCOSE 90 110*  CALCIUM 7.8* 8.3*   No results for input(s): LABPT, INR in the last 72 hours.  Neurologically intact ABD soft Neurovascular intact Sensation intact distally Intact pulses distally Compartment soft  Assessment/Plan: 4 Days Post-Op Procedure(s) (LRB): IRRIGATION AND DEBRIDEMENT OF FOOT (Right) Advance diet Up with therapyAt this point the patient may be discharged home.  I'll plan on setting her back in the office later this week to make sure there's no worsening of his cellulitis.  Samera Macy L 12/09/2014, 12:45 PM

## 2014-12-10 LAB — BASIC METABOLIC PANEL
ANION GAP: 10 (ref 5–15)
BUN: 21 mg/dL — ABNORMAL HIGH (ref 6–20)
CO2: 22 mmol/L (ref 22–32)
Calcium: 8.5 mg/dL — ABNORMAL LOW (ref 8.9–10.3)
Chloride: 108 mmol/L (ref 101–111)
Creatinine, Ser: 2.25 mg/dL — ABNORMAL HIGH (ref 0.61–1.24)
GFR calc Af Amer: 39 mL/min — ABNORMAL LOW (ref >60)
GFR, EST NON AFRICAN AMERICAN: 34 mL/min — AB (ref >60)
Glucose, Bld: 98 mg/dL (ref 70–99)
Potassium: 4.2 mmol/L (ref 3.5–5.1)
SODIUM: 140 mmol/L (ref 135–145)

## 2014-12-10 LAB — GLUCOSE, CAPILLARY
Glucose-Capillary: 134 mg/dL — ABNORMAL HIGH (ref 70–99)
Glucose-Capillary: 117 mg/dL — ABNORMAL HIGH (ref 70–99)
Glucose-Capillary: 148 mg/dL — ABNORMAL HIGH (ref 70–99)

## 2014-12-10 LAB — ANAEROBIC CULTURE

## 2014-12-10 MED ORDER — OXYCODONE-ACETAMINOPHEN 5-325 MG PO TABS: 1.0000 | ORAL_TABLET | ORAL | Status: DC | PRN

## 2014-12-10 MED ORDER — CEPHALEXIN 500 MG PO CAPS: 500.0000 mg | ORAL_CAPSULE | Freq: Three times a day (TID) | ORAL | Status: AC

## 2014-12-10 MED ORDER — INSULIN NPH ISOPHANE & REGULAR (70-30) 100 UNIT/ML ~~LOC~~ SUSP: 16.0000 [IU] | Freq: Two times a day (BID) | SUBCUTANEOUS | Status: AC

## 2014-12-10 MED ORDER — SENNOSIDES-DOCUSATE SODIUM 8.6-50 MG PO TABS: 1.0000 | ORAL_TABLET | Freq: Every evening | ORAL | Status: AC | PRN

## 2014-12-10 MED ORDER — CEPHALEXIN 500 MG PO CAPS: 500.0000 mg | ORAL_CAPSULE | Freq: Three times a day (TID) | ORAL | Status: DC

## 2014-12-10 MED ORDER — OXYCODONE-ACETAMINOPHEN 5-325 MG PO TABS: 1.0000 | ORAL_TABLET | Freq: Four times a day (QID) | ORAL | Status: AC | PRN

## 2014-12-10 NOTE — Care Management Note (Addendum)
Case Management Note  Patient Details  Name: Juan Werner MRN: [id number] Date of Birth: 12/07/1970  Subjective/Objective:                    Action/Plan: Explained MATCH letter and follow up appointment via telephone interpreter . Patient voiced understanding.    Expected Discharge Date:                  Expected Discharge Plan:  Home/Self Care  In-House Referral:  Artistinancial Counselor, Interpreting Services  Discharge planning Services  [facility], Med[facility]  Post Acute Care Choice:    Choice offered to:     DME Arranged:   walker DME Agency:   [facility]  HH Arranged:    HH Agency:     Status of Service:     Medicare Important Message Given:    Date Medicare IM Given:    Medicare IM give by:    Date Additional Medicare IM Given:    Additional Medicare Important Message give by:     If discussed at Long Length of Stay Meetings, dates discussed:    Additional Comments:  [name], [name], RN 12/10/2014, 12:27 PM

## 2014-12-10 NOTE — Discharge Summary (Signed)
Physician Discharge Summary  Juan Werner VQQ:[id number] DOB: 1971-05-30 DOA: 12/01/2014  PCP: No primary care provider on file.  Admit date: 12/01/2014 Discharge date: 12/10/2014  Time spent: 45 minutes  Recommendations for Outpatient Follow-up:  1. [facility], please check Bmet upon FU 2. Dr.Graves in 1 week, wound recheck  Discharge Diagnoses:  Principal Problem:   Cellulitis and abscess of right foot   Neuropathic foot ulcer   Essential hypertension   Diabetic neuropathy   CKD (chronic kidney disease)   Abscess of foot   Aki on CKD   Vancomycin toxicity  Discharge Condition: stable  Diet recommendation: diabetic  Filed Weights   12/01/14 1707 12/01/14 2305  Weight: 69.718 kg (153 lb 11.2 oz) 69.2 kg (152 lb 8.9 oz)    History of present illness:  Chief Complaint: Leg pain HPI: Juan Werner is a 44 y.o. male with Past medical history of diabetes mellitus hypertension and neuropathy. The patient presented to ER with complaints of R foot pain, 2 days prior while at work he placed his leg on a nail and suffered an injury. After that he had progressively worsening swelling of his foot with redness and pain  Hospital Course:  Cellulitis and Abscess of the right foot  -following puncture wound,MRI -possible subcut early abscess, no deep abscess or osteo -had persistent fevers and only mild improvement initially with Abx alone, then seen by Dr.Graves in consultation -s/p I&D and penrose drain 5/6 early am,  -s/p 7 days of of IV Vanc/Zosyn, Wound Cx with MSSA -changed to PO Keflex 5/9, drain removed per ORtho -Mobilized, seen by PT, Ambulate using walker -taught wound care, FU with Dr.Graves in office -CM made FU at [facility] for 5/13  AKi on CKD 3 -baseline creatinine around 1.6-1.7 -worsened 5/8 due to VAnc toxicity and was on ACE till 5/6 -improving creatinine 2.2 upon discharge, ACe stopped -will need recheck at the time  of FU on Friday  Hypertension Continue Norvasc, stopped ACE  Diabetes mellitus -hbA1c 8.6 -COntinue insulin 70/30, dose changed  to 16 units twice a day  Peripheral neuropathy Continue gabapentin  Nausea/contipation -reoslved -with miralax and senokot   Procedures: 5/6: PROCEDURE: Irrigation and debridement of right abscess of the foot with placement of Penrose drain.  Consultations:  Dr.Graves  Discharge Exam: Filed Vitals:   12/10/14 1242  BP: 147/72  Pulse: 82  Temp: 98.7 F (37.1 C)  Resp: 18    General: AAOx3 Cardiovascular: S1s2/rrr Respiratory:ctab  Discharge Instructions   Discharge Instructions    Diet - low sodium heart healthy    Complete by:  As directed      Diet Carb Modified    Complete by:  As directed      Increase activity slowly    Complete by:  As directed           Current Discharge Medication List    START taking these medications   Details  cephALEXin (KEFLEX) 500 MG capsule Take 1 capsule (500 mg total) by mouth every 8 (eight) hours. For 10days Qty: 30 capsule, Refills: 0    oxyCODONE-acetaminophen (PERCOCET/ROXICET) 5-325 MG per tablet Take 1 tablet by mouth every 6 (six) hours as needed for moderate pain or severe pain. Qty: 30 tablet, Refills: 0    senna-docusate (SENOKOT-S) 8.6-50 MG per tablet Take 1 tablet by mouth at bedtime as needed for mild constipation. Qty: 15 tablet, Refills: 0      CONTINUE these medications which  have CHANGED   Details  insulin NPH-regular Human (NOVOLIN 70/30) (70-30) 100 UNIT/ML injection Inject 16 Units into the skin 2 (two) times daily with a meal. Qty: 10 mL, Refills: 11      CONTINUE these medications which have NOT CHANGED   Details  acetaminophen (TYLENOL) 325 MG tablet Take 325 mg by mouth every 6 (six) hours as needed for mild pain or moderate pain.    amLODipine (NORVASC) 10 MG tablet Take 10 mg by mouth daily.    gabapentin (NEURONTIN) 100 MG capsule Take 100 mg by mouth  2 (two) times daily.      STOP taking these medications     lisinopril (PRINIVIL,ZESTRIL) 40 MG tablet        No Known Allergies Follow-up Information    Follow up with [facility]    .   Why:  Hospital follow up appointment Friday Dec 13, 2014 at 0900 am be there at 0845 am    Contact information:   [address] [place] [id number] [phone]      Follow up with Harvie JuniorGRAVES,JOHN L, MD. Schedule an appointment as soon as possible for a visit on 12/16/2014.   Specialty:  Orthopedic Surgery   Contact information:   Vivianne Spence[address] [place] [address] [phone]        The results of significant diagnostics from this hospitalization (including imaging, microbiology, ancillary and laboratory) are listed below for reference.    Significant Diagnostic Studies: Dg Abd 1 View  12/03/2014   CLINICAL DATA:  Patient with nausea and vomiting.  EXAM: ABDOMEN - 1 VIEW  COMPARISON:  None.  FINDINGS: Gas is demonstrated within non dilated loops of large and small bowel in a nonobstructed pattern. Supine evaluation limited for the detection of free intraperitoneal air. Large amount of stool within the transverse and descending colon. Unremarkable osseous skeleton.  IMPRESSION: Nonobstructed bowel gas pattern.   Electronically Signed   By: Annia Beltrew  Davis M.D.   On: 12/03/2014 12:50   Mr Foot Right Wo Contrast  12/05/2014   CLINICAL DATA:  Puncture wound on the volar surface of the right midfoot 2 days ago with redness, swelling and tenderness extending to the medial malleolus.  EXAM: MRI OF THE RIGHT FOREFOOT WITHOUT CONTRAST  TECHNIQUE: Multiplanar, multisequence MR imaging was performed. No intravenous contrast was administered.  COMPARISON:  Plain films right foot 12/01/2014.  FINDINGS: Diffuse subcutaneous edema is seen about the foot and ankle. More focal edema is seen in subcutaneous fat on the plantar surface of the foot where there appears to be  a puncture wound. This area of edema measures 2.0 cm long by 2.7 cm transverse by 0.8 cm craniocaudal and may represent phlegmon or early abscess. No intramuscular abscess is identified. There is no bone marrow signal to suggest osteomyelitis. Intrinsic musculature the foot demonstrates mild edema throughout likely due to denervation atrophy.  IMPRESSION: Puncture wound on the plantar surface of the foot with underlying focal edema in subcutaneous fat which may represent phlegmon/early abscess. No deep abscess or evidence of osteomyelitis is identified.  Increased T2 signal throughout intrinsic musculature the foot is likely due to denervation atrophy.   Electronically Signed   By: Drusilla Kannerhomas  Dalessio M.D.   On: 12/05/2014 08:22   Dg Foot Complete Right  12/01/2014   CLINICAL DATA:  Right foot pain stepped on nail 3 days ago redness swelling around foot cellulitis puncture wound lateral aspect first toe  EXAM: RIGHT  FOOT COMPLETE - 3+ VIEW  COMPARISON:  None.  FINDINGS: Soft tissue swelling first toe. No fracture or dislocation. No radiodense foreign body. No evidence of periosteal reaction or osteomyelitis.  IMPRESSION: Soft tissue swelling suggesting cellulitis.   Electronically Signed   By: Esperanza Heir M.D.   On: 12/01/2014 20:18    Microbiology: Recent Results (from the past 240 hour(s))  Culture, blood (routine x 2)     Status: None   Collection Time: 12/01/14 10:00 PM  Result Value Ref Range Status   Specimen Description BLOOD RIGHT HAND  Final   Special Requests   Final    BOTTLES DRAWN AEROBIC AND ANAEROBIC 8CC EA PT ON ZOSYN,VANCOMYCIN   Culture   Final    NO GROWTH 5 DAYS Performed at [facility]    Report Status 12/08/2014 FINAL  Final  Culture, blood (routine x 2)     Status: None   Collection Time: 12/01/14 10:09 PM  Result Value Ref Range Status   Specimen Description BLOOD LEFT ARM  Final   Special Requests   Final    BOTTLES DRAWN AEROBIC ONLY 10CC PT ON  ZOSYN,VANCOMYCIN   Culture   Final    NO GROWTH 5 DAYS Note: Culture results may be compromised due to an excessive volume of blood received in culture bottles. Performed at [facility]    Report Status 12/08/2014 FINAL  Final  Surgical pcr screen     Status: Abnormal   Collection Time: 12/05/14  4:28 PM  Result Value Ref Range Status   MRSA, PCR NEGATIVE NEGATIVE Final   Staphylococcus aureus POSITIVE (A) NEGATIVE Final    Comment:        The Xpert SA Assay (FDA approved for NASAL specimens in patients over 57 years of age), is one component of a comprehensive surveillance program.  Test performance has been validated by [facility] for patients greater than or equal to 59 year old. It is not intended to diagnose infection nor to guide or monitor treatment.   Culture, routine-abscess     Status: None   Collection Time: 12/05/14  8:54 PM  Result Value Ref Range Status   Specimen Description ABSCESS RIGHT FOOT  Final   Special Requests PATIENT ON FOLLOWING ZOSYN AND VANCO  Final   Gram Stain   Final    RARE WBC PRESENT,BOTH PMN AND MONONUCLEAR NO SQUAMOUS EPITHELIAL CELLS SEEN FEW GRAM POSITIVE COCCI IN PAIRS IN CLUSTERS Performed at [facility]    Culture   Final    MODERATE STAPHYLOCOCCUS AUREUS Note: RIFAMPIN AND GENTAMICIN SHOULD NOT BE USED AS SINGLE DRUGS FOR TREATMENT OF STAPH INFECTIONS. Performed at [facility]    Report Status 12/08/2014 FINAL  Final   Organism ID, Bacteria STAPHYLOCOCCUS AUREUS  Final      Susceptibility   Staphylococcus aureus - MIC*    CLINDAMYCIN <=0.25 SENSITIVE Sensitive     ERYTHROMYCIN <=0.25 SENSITIVE Sensitive     GENTAMICIN <=0.5 SENSITIVE Sensitive     LEVOFLOXACIN <=0.12 SENSITIVE Sensitive     OXACILLIN 0.5 SENSITIVE Sensitive     PENICILLIN >=0.5 RESISTANT Resistant     RIFAMPIN <=0.5 SENSITIVE Sensitive     TRIMETH/SULFA <=10 SENSITIVE Sensitive     VANCOMYCIN 1 SENSITIVE Sensitive      TETRACYCLINE <=1 SENSITIVE Sensitive     MOXIFLOXACIN <=0.25 SENSITIVE Sensitive     * MODERATE STAPHYLOCOCCUS AUREUS  Anaerobic culture     Status: None   Collection  Time: 12/05/14  8:54 PM  Result Value Ref Range Status   Specimen Description ABSCESS RIGHT FOOT  Final   Special Requests PATIENT ON FOLLOWING ZOSYN VANCOMYCIN  Final   Gram Stain   Final    RARE WBC PRESENT,BOTH PMN AND MONONUCLEAR NO SQUAMOUS EPITHELIAL CELLS SEEN FEW GRAM POSITIVE COCCI IN PAIRS IN CLUSTERS [facility] at [facility]    Culture   Final    NO ANAEROBES ISOLATED Performed at [facility]    Report Status 12/10/2014 FINAL  Final     Labs: Basic Metabolic Panel:  Recent Labs Lab 12/05/14 0452 12/07/14 0416 12/09/14 0615 12/10/14 0520  NA 136 133* 139 140  K 4.2 4.0 4.3 4.2  CL 103 99* 107 108  CO2 25 23 24 22   GLUCOSE 96 90 110* 98  BUN 17 25* 24* 21*  CREATININE 1.79* 2.48* 2.48* 2.25*  CALCIUM 8.3* 7.8* 8.3* 8.5*   Liver Function Tests: No results for input(s): AST, ALT, ALKPHOS, BILITOT, PROT, ALBUMIN in the last 168 hours. No results for input(s): LIPASE, AMYLASE in the last 168 hours. No results for input(s): AMMONIA in the last 168 hours. CBC:  Recent Labs Lab 12/05/14 0452 12/07/14 0416 12/09/14 0615  WBC 16.1* 15.4* 12.8*  HGB 9.6* 8.2* 8.5*  HCT 28.7* 24.6* 25.3*  MCV 85.2 86.0 86.3  PLT 270 273 363   Cardiac Enzymes: No results for input(s): CKTOTAL, CKMB, CKMBINDEX, TROPONINI in the last 168 hours. BNP: BNP (last 3 results) No results for input(s): BNP in the last 8760 hours.  ProBNP (last 3 results) No results for input(s): PROBNP in the last 8760 hours.  CBG:  Recent Labs Lab 12/09/14 1133 12/09/14 1702 12/09/14 2149 12/10/14 0716 12/10/14 1110  GLUCAP 149* 156* 77 134* 117*       Signed:  Reynolds Kittel  [facility] 12/10/2014, 2:20 PM

## 2014-12-10 NOTE — Progress Notes (Signed)
Catalina Lungerrnesto Vanzile to be D/C'd Home per MD order.  Discussed with the patient and all questions fully answered.  VSS, Compression wrap in place over surgical site and is clean, dry, intact.   PICC line d/c'd by IV team RN. Site clean, dry, intact.  An After Visit Summary was printed and given to the patient. Patient received prescriptions. Follow up appointments in place and rolling walker delivered for patient's home use.  D/c education completed with patient/family via interpreter including follow up instructions, medication list, d/c activities limitations if indicated, with other d/c instructions as indicated by MD - patient able to verbalize understanding, all questions fully answered.   Patient instructed to return to ED, call 911, or call MD for any changes in condition.   Patient escorted via WC, and D/C home via private auto.  Burt EkCook, Castella Lerner D 12/10/2014 12:27 PM

## 2014-12-11 NOTE — Op Note (Signed)
NAMMarland Kitchen:  Juan Werner, Juan Werner            ACCOUNT NO.:  [id number]  MEDICAL RECORD NO.:  [id number]  LOCATION:  [id number]                        FACILITY:  [facility]  PHYSICIAN:  Harvie JuniorJohn L. Park Beck, M.D.   DATE OF BIRTH:  25-Feb-1971  DATE OF PROCEDURE:  12/05/2014 DATE OF DISCHARGE:  12/10/2014                              OPERATIVE REPORT   ADDENDUM:  The patient underwent irrigation and excisional debridement of right foot.  This excision was done with a scalpel, scissors, rongeur, and elevator to extend up into the ankle area.  We excised the skin, subcutaneous tissue, muscle, fat, and fascia.  There was a tremendous amount of pus and other fluid that was removed from the wound as well.     Harvie JuniorJohn L. Krina Mraz, M.D.     Ranae PlumberJLG/MEDQ  D:  12/10/2014  T:  12/11/2014  Job:  [id number]

## 2014-12-13 ENCOUNTER — Inpatient Hospital Stay: Payer: Self-pay | Admitting: Family Medicine

## 2015-08-26 ENCOUNTER — Encounter: Payer: Self-pay | Admitting: *Deleted

## 2016-03-25 IMAGING — MR MR FOOT*R* W/O CM
4 of 5 series · 25 of 40 positions shown · non-contrast
Comparison: Plain films right foot 12/01/2014.

CLINICAL DATA: Puncture wound on the volar surface of the right
midfoot 2 days ago with redness, swelling and tenderness extending
to the medial malleolus.

EXAM:
MRI OF THE RIGHT FOREFOOT WITHOUT CONTRAST
TECHNIQUE: Multiplanar, multisequence MR imaging was performed. No intravenous
contrast was administered.

[Series 3: STIR · sagittal · 3.0mm · 0.47mm/px · 3 of 31 slices shown]
[im 7/31]
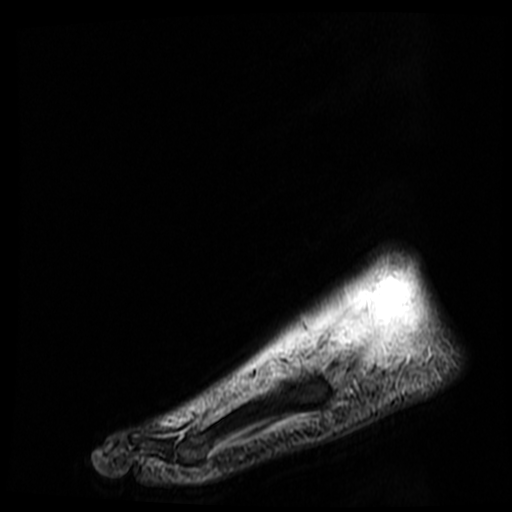
[im 19/31]
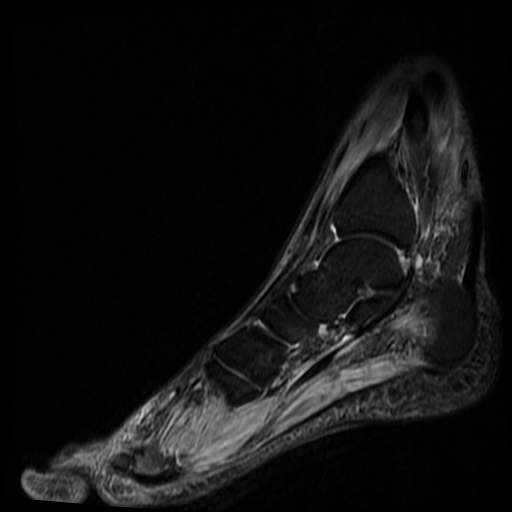
[im 31/31]
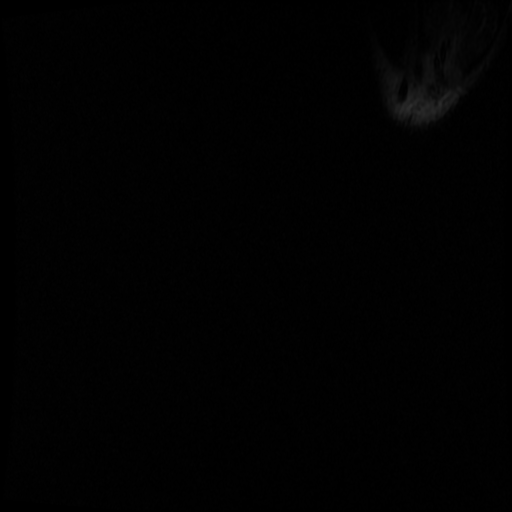

[Series 5: T1 · coronal · 4.0mm · 0.35mm/px · 8 of 44 slices shown (1 of 2)]
[im 1/44]
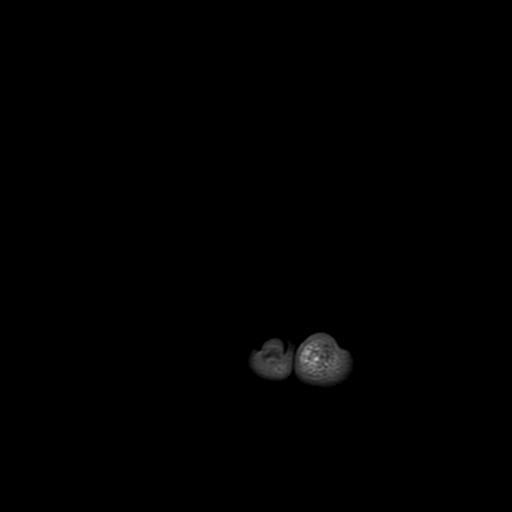
[im 5/44]
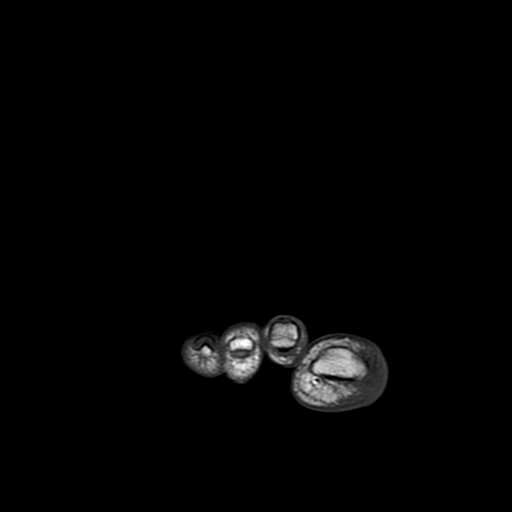
[im 15/44]
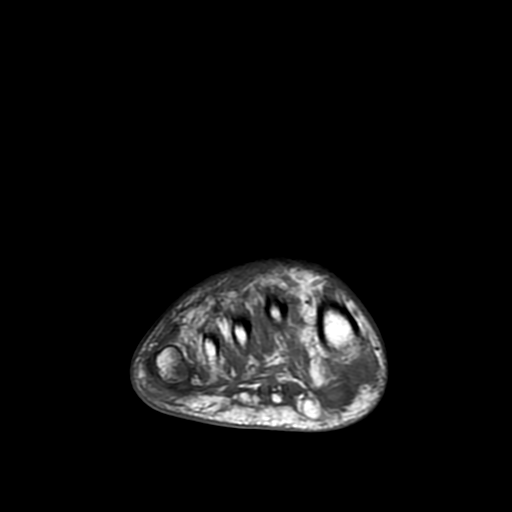
[im 20/44]
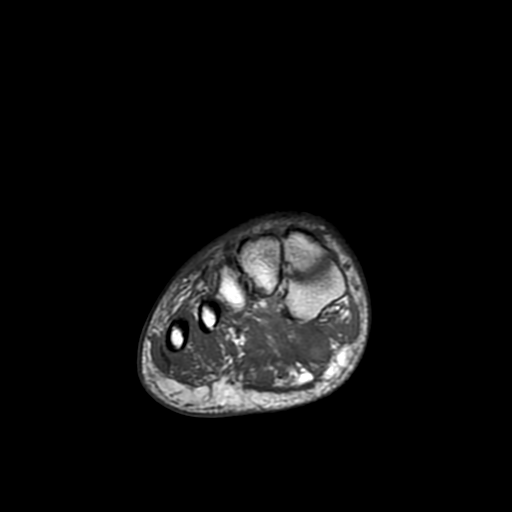
[im 24/44]
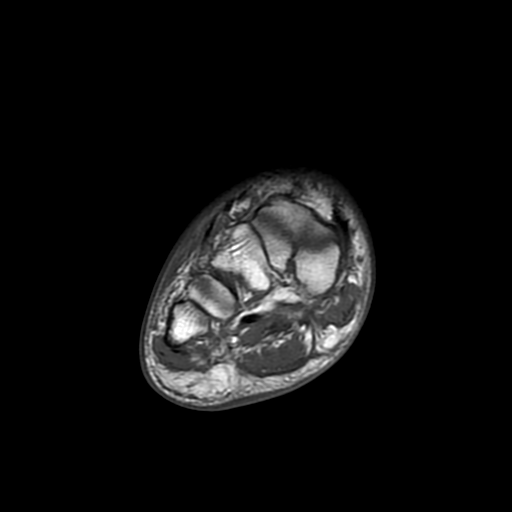
[im 29/44]
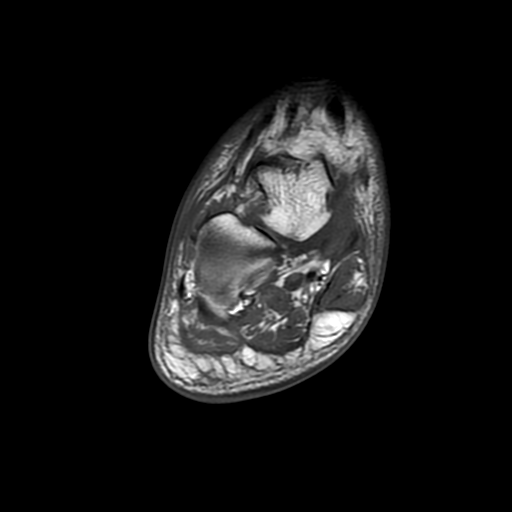
[im 39/44]
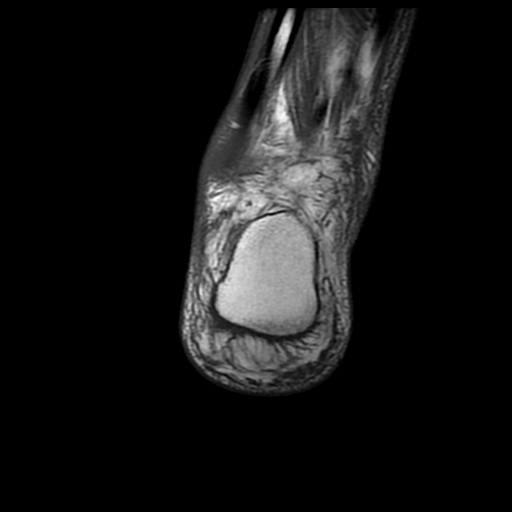
[im 44/44]
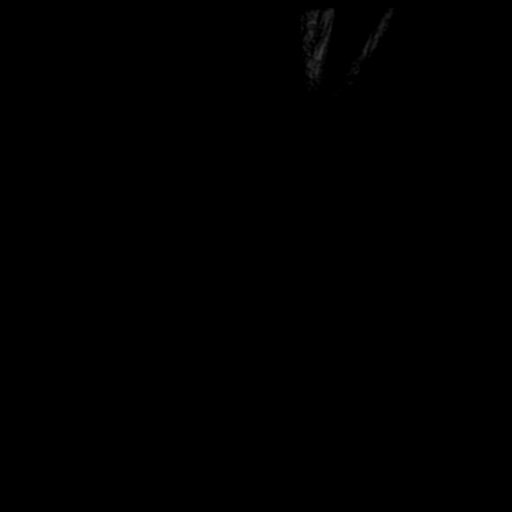

[Series 6: T1 · axial · 3.0mm · 0.51mm/px · z∈[-132,-53]mm · 6 of 31 slices shown (2 of 2)]
[im 1/31]
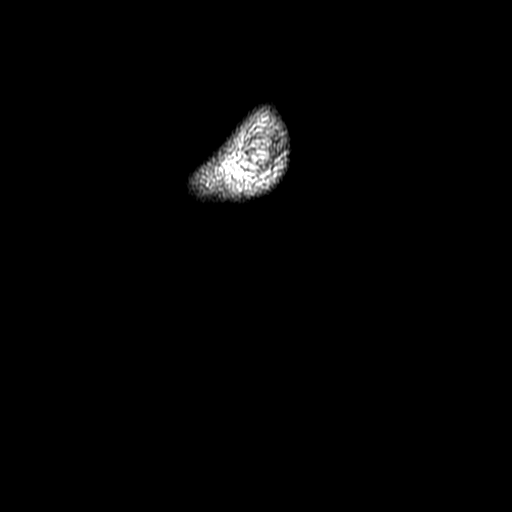
[im 6/31]
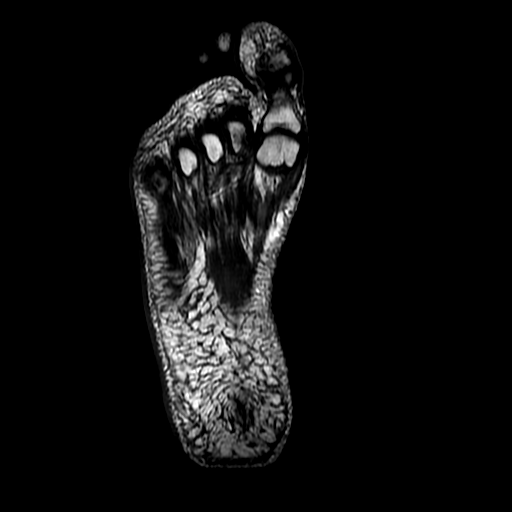
[im 11/31]
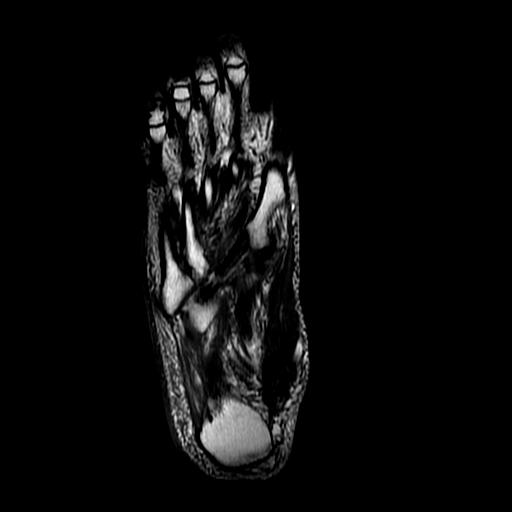
[im 16/31]
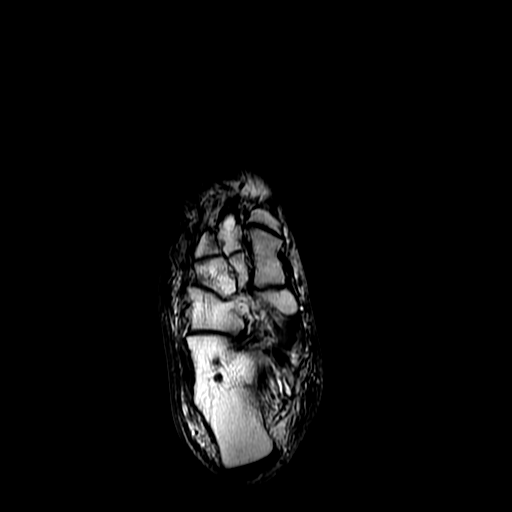
[im 21/31]
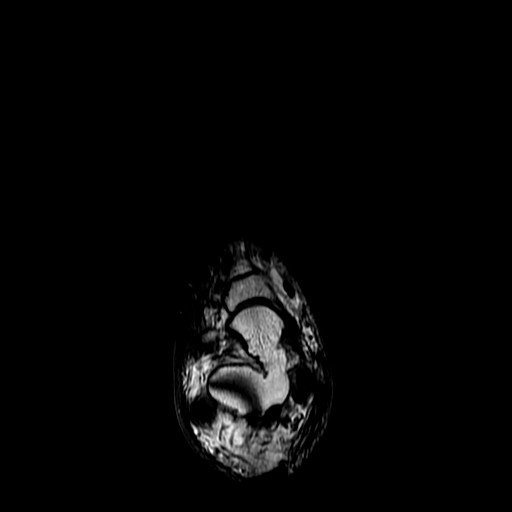
[im 26/31]
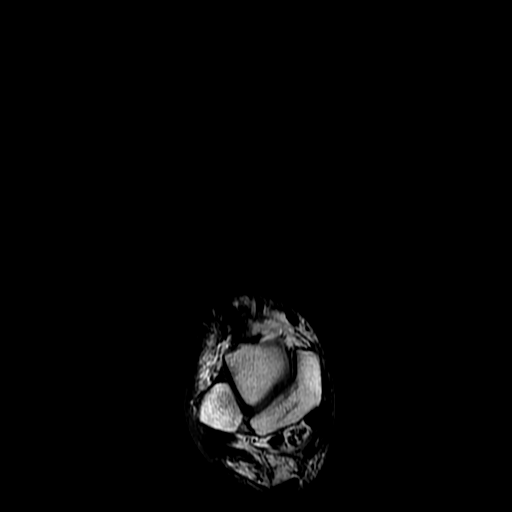

[Series 7: T2 fat-sat · coronal · 4.0mm · 0.70mm/px · 8 of 44 slices shown]
[im 1/44]
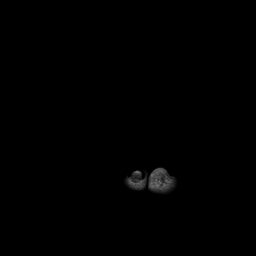
[im 5/44]
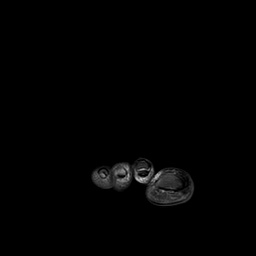
[im 15/44]
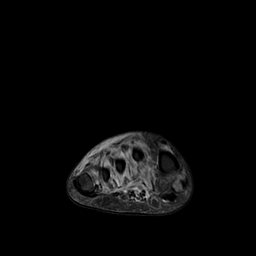
[im 20/44]
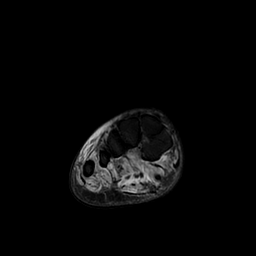
[im 24/44]
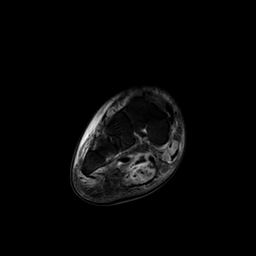
[im 29/44]
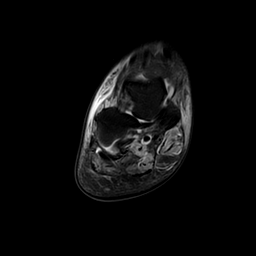
[im 39/44]
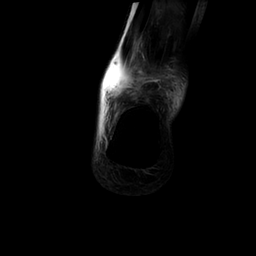
[im 44/44]
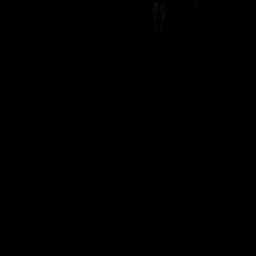

[25 of 40 positions shown; findings below may reference images not displayed]

FINDINGS: Diffuse subcutaneous edema is seen about the foot and ankle. More
focal edema is seen in subcutaneous fat on the plantar surface of
the foot where there appears to be a puncture wound. This area of
edema measures 2.0 cm long by 2.7 cm transverse by 0.8 cm
craniocaudal and may represent phlegmon or early abscess. No
intramuscular abscess is identified. There is no bone marrow signal
to suggest osteomyelitis. Intrinsic musculature the foot
demonstrates mild edema throughout likely due to denervation
atrophy.
IMPRESSION: Puncture wound on the plantar surface of the foot with underlying
focal edema in subcutaneous fat which may represent phlegmon/early
abscess. No deep abscess or evidence of osteomyelitis is identified.

Increased T2 signal throughout intrinsic musculature the foot is
likely due to denervation atrophy.
